# Patient Record
Sex: Male | Born: 1940 | Race: White | Hispanic: No | Smoking: Former smoker
Health system: Southern US, Community
[De-identification: ages and names within clinical notes are randomized; demographics above are authoritative.]

## PROBLEM LIST (undated history)

## (undated) DIAGNOSIS — M1990 Unspecified osteoarthritis, unspecified site: Secondary | ICD-10-CM

## (undated) DIAGNOSIS — I10 Essential (primary) hypertension: Secondary | ICD-10-CM

## (undated) DIAGNOSIS — G4730 Sleep apnea, unspecified: Secondary | ICD-10-CM

## (undated) DIAGNOSIS — I351 Nonrheumatic aortic (valve) insufficiency: Secondary | ICD-10-CM

## (undated) DIAGNOSIS — I2510 Atherosclerotic heart disease of native coronary artery without angina pectoris: Secondary | ICD-10-CM

## (undated) DIAGNOSIS — N411 Chronic prostatitis: Secondary | ICD-10-CM

## (undated) DIAGNOSIS — K219 Gastro-esophageal reflux disease without esophagitis: Secondary | ICD-10-CM

## (undated) DIAGNOSIS — I4891 Unspecified atrial fibrillation: Secondary | ICD-10-CM

## (undated) HISTORY — PX: CERVICAL SPINE SURGERY: SHX589

## (undated) HISTORY — PX: LUMBAR SPINE SURGERY: SHX701

## (undated) HISTORY — DX: Atherosclerotic heart disease of native coronary artery without angina pectoris: I25.10

## (undated) HISTORY — DX: Sleep apnea, unspecified: G47.30

## (undated) HISTORY — DX: Nonrheumatic aortic (valve) insufficiency: I35.1

## (undated) HISTORY — DX: Unspecified atrial fibrillation: I48.91

## (undated) HISTORY — PX: JOINT REPLACEMENT: SHX530

---

## 2012-07-02 ENCOUNTER — Other Ambulatory Visit: Payer: Self-pay | Admitting: Neurosurgery

## 2012-07-23 ENCOUNTER — Encounter (HOSPITAL_COMMUNITY): Payer: Self-pay | Admitting: Respiratory Therapy

## 2012-07-29 NOTE — Pre-Procedure Instructions (Signed)
20 ROHNAN BARTLESON  07/29/2012   Your procedure is scheduled on:  Wednesday, august 28th  Report to [facility] at 0630 AM.  Call this number if you have problems the morning of surgery: [phone]   Remember:   Do not eat food or drink:After Midnight.  Take these medicines the morning of surgery with A SIP OF WATER: metoprolol, proscar, prilosec, pain medication if needed   Do not wear jewelry, make-up or nail polish.  Do not wear lotions, powders, or perfumes.  Do not shave 48 hours prior to surgery. Men may shave face and neck.  Do not bring valuables to the hospital.  Contacts, dentures or bridgework may not be worn into surgery.  Leave suitcase in the car. After surgery it may be brought to your room.  For patients admitted to the hospital, checkout time is 11:00 AM the day of discharge.   Patients discharged the day of surgery will not be allowed to drive home.   Special Instructions: CHG Shower Use Special Wash: 1/2 bottle night before surgery and 1/2 bottle morning of surgery.   Please read over the following fact sheets that you were given: Pain Booklet, Coughing and Deep Breathing, Blood Transfusion Information, MRSA Information and Surgical Site Infection Prevention

## 2012-07-30 ENCOUNTER — Encounter (HOSPITAL_COMMUNITY): Payer: Self-pay

## 2012-07-30 ENCOUNTER — Ambulatory Visit (HOSPITAL_COMMUNITY)
Admission: RE | Admit: 2012-07-30 | Discharge: 2012-07-30 | Disposition: A | Discharge status: Home / Self Care | Payer: Medicare Other | Source: Ambulatory Visit | Attending: Neurosurgery | Admitting: Neurosurgery

## 2012-07-30 ENCOUNTER — Encounter (HOSPITAL_COMMUNITY)
Admission: RE | Admit: 2012-07-30 | Discharge: 2012-07-30 | Disposition: A | Discharge status: Home / Self Care | Payer: Medicare Other | Source: Ambulatory Visit | Attending: Neurosurgery | Admitting: Neurosurgery

## 2012-07-30 DIAGNOSIS — Z01818 Encounter for other preprocedural examination: Secondary | ICD-10-CM | POA: Insufficient documentation

## 2012-07-30 DIAGNOSIS — Z01812 Encounter for preprocedural laboratory examination: Secondary | ICD-10-CM | POA: Insufficient documentation

## 2012-07-30 DIAGNOSIS — Z01810 Encounter for preprocedural cardiovascular examination: Secondary | ICD-10-CM | POA: Insufficient documentation

## 2012-07-30 DIAGNOSIS — I10 Essential (primary) hypertension: Secondary | ICD-10-CM | POA: Insufficient documentation

## 2012-07-30 HISTORY — DX: Unspecified osteoarthritis, unspecified site: M19.90

## 2012-07-30 HISTORY — DX: Essential (primary) hypertension: I10

## 2012-07-30 HISTORY — DX: Gastro-esophageal reflux disease without esophagitis: K21.9

## 2012-07-30 LAB — TYPE AND SCREEN
ABO/RH(D): B POS
Antibody Screen: NEGATIVE

## 2012-07-30 LAB — BASIC METABOLIC PANEL
BUN: 18 mg/dL (ref 6–23)
CO2: 28 mEq/L (ref 19–32)
Calcium: 10.1 mg/dL (ref 8.4–10.5)
Chloride: 102 mEq/L (ref 96–112)
Creatinine, Ser: 0.98 mg/dL (ref 0.50–1.35)
GFR calc Af Amer: >90 mL/min (ref >90)
GFR calc non Af Amer: 80 mL/min — ABNORMAL LOW (ref >90)
Glucose, Bld: 102 mg/dL — ABNORMAL HIGH (ref 70–99)
Potassium: 4.9 mEq/L (ref 3.5–5.1)
Sodium: 139 mEq/L (ref 135–145)

## 2012-07-30 LAB — SURGICAL PCR SCREEN
MRSA, PCR: NEGATIVE
Staphylococcus aureus: POSITIVE — AB

## 2012-07-30 LAB — CBC
HCT: 41.7 % (ref 39.0–52.0)
Hemoglobin: 13.9 g/dL (ref 13.0–17.0)
MCH: 27.1 pg (ref 26.0–34.0)
MCHC: 33.3 g/dL (ref 30.0–36.0)
MCV: 81.4 fL (ref 78.0–100.0)
Platelets: 249 10*3/uL (ref 150–400)
RBC: 5.12 MIL/uL (ref 4.22–5.81)
RDW: 13.6 % (ref 11.5–15.5)
WBC: 5.8 10*3/uL (ref 4.0–10.5)

## 2012-07-30 LAB — ABO/RH: ABO/RH(D): B POS

## 2012-08-05 MED ORDER — CEFAZOLIN SODIUM-DEXTROSE 2-3 GM-% IV SOLR
2.0000 g | INTRAVENOUS | Status: AC
  Administered 2012-08-06: 1 g via INTRAVENOUS
  Administered 2012-08-06: 2 g via INTRAVENOUS
  Filled 2012-08-05: qty 50

## 2012-08-06 ENCOUNTER — Inpatient Hospital Stay (HOSPITAL_COMMUNITY)
Admission: RE | Admit: 2012-08-06 | Discharge: 2012-08-09 | DRG: 460 | Disposition: A | Discharge status: Home / Self Care | Payer: Medicare Other | Source: Ambulatory Visit | Attending: Neurosurgery | Admitting: Neurosurgery

## 2012-08-06 ENCOUNTER — Ambulatory Visit (HOSPITAL_COMMUNITY): Payer: Medicare Other

## 2012-08-06 ENCOUNTER — Encounter (HOSPITAL_COMMUNITY): Payer: Self-pay | Admitting: Anesthesiology

## 2012-08-06 ENCOUNTER — Encounter (HOSPITAL_COMMUNITY): Payer: Self-pay | Admitting: Surgery

## 2012-08-06 ENCOUNTER — Ambulatory Visit (HOSPITAL_COMMUNITY): Payer: Medicare Other | Admitting: Anesthesiology

## 2012-08-06 ENCOUNTER — Encounter (HOSPITAL_COMMUNITY)
Admission: RE | Disposition: A | Discharge status: Home / Self Care | Payer: Self-pay | Source: Ambulatory Visit | Attending: Neurosurgery

## 2012-08-06 DIAGNOSIS — M47817 Spondylosis without myelopathy or radiculopathy, lumbosacral region: Secondary | ICD-10-CM | POA: Diagnosis present

## 2012-08-06 DIAGNOSIS — R339 Retention of urine, unspecified: Secondary | ICD-10-CM | POA: Diagnosis not present

## 2012-08-06 DIAGNOSIS — M48062 Spinal stenosis, lumbar region with neurogenic claudication: Secondary | ICD-10-CM | POA: Diagnosis present

## 2012-08-06 DIAGNOSIS — Z833 Family history of diabetes mellitus: Secondary | ICD-10-CM

## 2012-08-06 DIAGNOSIS — Z79899 Other long term (current) drug therapy: Secondary | ICD-10-CM

## 2012-08-06 DIAGNOSIS — Q762 Congenital spondylolisthesis: Secondary | ICD-10-CM

## 2012-08-06 DIAGNOSIS — M5137 Other intervertebral disc degeneration, lumbosacral region: Principal | ICD-10-CM | POA: Diagnosis present

## 2012-08-06 DIAGNOSIS — Z823 Family history of stroke: Secondary | ICD-10-CM

## 2012-08-06 DIAGNOSIS — I10 Essential (primary) hypertension: Secondary | ICD-10-CM | POA: Diagnosis present

## 2012-08-06 DIAGNOSIS — Z981 Arthrodesis status: Secondary | ICD-10-CM

## 2012-08-06 DIAGNOSIS — M51379 Other intervertebral disc degeneration, lumbosacral region without mention of lumbar back pain or lower extremity pain: Principal | ICD-10-CM | POA: Diagnosis present

## 2012-08-06 DIAGNOSIS — Z7982 Long term (current) use of aspirin: Secondary | ICD-10-CM

## 2012-08-06 DIAGNOSIS — Z96649 Presence of unspecified artificial hip joint: Secondary | ICD-10-CM

## 2012-08-06 DIAGNOSIS — Z8249 Family history of ischemic heart disease and other diseases of the circulatory system: Secondary | ICD-10-CM

## 2012-08-06 SURGERY — POSTERIOR LUMBAR FUSION 2 LEVEL
Anesthesia: General | Site: Back | Wound class: Clean

## 2012-08-06 MED ORDER — ONDANSETRON HCL 4 MG/2ML IJ SOLN: 4.0000 mg | Freq: Once | INTRAMUSCULAR | Status: DC | PRN

## 2012-08-06 MED ORDER — HYDROXYZINE HCL 25 MG PO TABS
50.0000 mg | ORAL_TABLET | ORAL | Status: DC | PRN
  Filled 2012-08-06: qty 2

## 2012-08-06 MED ORDER — METOPROLOL SUCCINATE ER 25 MG PO TB24
25.0000 mg | ORAL_TABLET | Freq: Every day | ORAL | Status: DC
  Administered 2012-08-07 – 2012-08-09 (×2): 25 mg via ORAL
  Filled 2012-08-06 (×4): qty 1

## 2012-08-06 MED ORDER — ONDANSETRON HCL 4 MG/2ML IJ SOLN
INTRAMUSCULAR | Status: AC
  Filled 2012-08-06: qty 2

## 2012-08-06 MED ORDER — EPHEDRINE SULFATE 50 MG/ML IJ SOLN
INTRAMUSCULAR | Status: DC | PRN
  Administered 2012-08-06 (×4): 10 mg via INTRAVENOUS

## 2012-08-06 MED ORDER — OXYCODONE-ACETAMINOPHEN 5-325 MG PO TABS
1.0000 | ORAL_TABLET | ORAL | Status: DC | PRN
  Administered 2012-08-08 – 2012-08-09 (×4): 2 via ORAL
  Filled 2012-08-06 (×4): qty 2

## 2012-08-06 MED ORDER — SODIUM CHLORIDE 0.9 % IJ SOLN: 3.0000 mL | INTRAMUSCULAR | Status: DC | PRN

## 2012-08-06 MED ORDER — HYDROCODONE-ACETAMINOPHEN 5-325 MG PO TABS: 1.0000 | ORAL_TABLET | ORAL | Status: DC | PRN

## 2012-08-06 MED ORDER — ACETAMINOPHEN 650 MG RE SUPP: 650.0000 mg | RECTAL | Status: DC | PRN

## 2012-08-06 MED ORDER — HYDROMORPHONE HCL PF 1 MG/ML IJ SOLN
INTRAMUSCULAR | Status: AC
  Filled 2012-08-06: qty 1

## 2012-08-06 MED ORDER — KETOROLAC TROMETHAMINE 30 MG/ML IJ SOLN: 15.0000 mg | Freq: Once | INTRAMUSCULAR | Status: DC

## 2012-08-06 MED ORDER — ACETAMINOPHEN 10 MG/ML IV SOLN
INTRAVENOUS | Status: DC | PRN
  Administered 2012-08-06: 1000 mg via INTRAVENOUS

## 2012-08-06 MED ORDER — LIDOCAINE HCL (CARDIAC) 20 MG/ML IV SOLN
INTRAVENOUS | Status: DC | PRN
  Administered 2012-08-06: 100 mg via INTRAVENOUS

## 2012-08-06 MED ORDER — ACETAMINOPHEN 10 MG/ML IV SOLN
INTRAVENOUS | Status: AC
  Filled 2012-08-06: qty 100

## 2012-08-06 MED ORDER — BISACODYL 10 MG RE SUPP
10.0000 mg | Freq: Every day | RECTAL | Status: DC | PRN
  Administered 2012-08-07: 10 mg via RECTAL
  Filled 2012-08-06: qty 1

## 2012-08-06 MED ORDER — SODIUM CHLORIDE 0.9 % IV SOLN
INTRAVENOUS | Status: AC
  Filled 2012-08-06: qty 500

## 2012-08-06 MED ORDER — BACITRACIN 50000 UNITS IM SOLR
INTRAMUSCULAR | Status: AC
  Filled 2012-08-06: qty 1

## 2012-08-06 MED ORDER — PHENOL 1.4 % MT LIQD: 1.0000 | OROMUCOSAL | Status: DC | PRN

## 2012-08-06 MED ORDER — CYCLOBENZAPRINE HCL 10 MG PO TABS
10.0000 mg | ORAL_TABLET | Freq: Three times a day (TID) | ORAL | Status: DC | PRN
  Administered 2012-08-07: 10 mg via ORAL
  Filled 2012-08-06: qty 1

## 2012-08-06 MED ORDER — FINASTERIDE 5 MG PO TABS
5.0000 mg | ORAL_TABLET | Freq: Every day | ORAL | Status: DC
  Administered 2012-08-07 – 2012-08-09 (×3): 5 mg via ORAL
  Filled 2012-08-06 (×4): qty 1

## 2012-08-06 MED ORDER — MENTHOL 3 MG MT LOZG
1.0000 | LOZENGE | OROMUCOSAL | Status: DC | PRN
  Filled 2012-08-06: qty 9

## 2012-08-06 MED ORDER — NEOSTIGMINE METHYLSULFATE 1 MG/ML IJ SOLN
INTRAMUSCULAR | Status: DC | PRN
  Administered 2012-08-06: 4 mg via INTRAVENOUS

## 2012-08-06 MED ORDER — ONDANSETRON HCL 4 MG/2ML IJ SOLN
INTRAMUSCULAR | Status: DC | PRN
  Administered 2012-08-06: 4 mg via INTRAVENOUS

## 2012-08-06 MED ORDER — KETOROLAC TROMETHAMINE 15 MG/ML IJ SOLN
15.0000 mg | Freq: Four times a day (QID) | INTRAMUSCULAR | Status: AC
  Administered 2012-08-06 – 2012-08-07 (×6): 15 mg via INTRAVENOUS
  Filled 2012-08-06 (×9): qty 1

## 2012-08-06 MED ORDER — ALBUMIN HUMAN 5 % IV SOLN
INTRAVENOUS | Status: DC | PRN
  Administered 2012-08-06: 12:00:00 via INTRAVENOUS

## 2012-08-06 MED ORDER — MIDAZOLAM HCL 5 MG/5ML IJ SOLN
INTRAMUSCULAR | Status: DC | PRN
  Administered 2012-08-06: 2 mg via INTRAVENOUS

## 2012-08-06 MED ORDER — LISINOPRIL 20 MG PO TABS
20.0000 mg | ORAL_TABLET | Freq: Every day | ORAL | Status: DC
  Administered 2012-08-07 – 2012-08-09 (×2): 20 mg via ORAL
  Filled 2012-08-06 (×4): qty 1

## 2012-08-06 MED ORDER — SODIUM CHLORIDE 0.9 % IR SOLN
Status: DC | PRN
  Administered 2012-08-06 (×2)

## 2012-08-06 MED ORDER — MAGNESIUM HYDROXIDE 400 MG/5ML PO SUSP
30.0000 mL | Freq: Every day | ORAL | Status: DC | PRN
  Administered 2012-08-07 – 2012-08-08 (×2): 30 mL via ORAL
  Filled 2012-08-06 (×2): qty 30

## 2012-08-06 MED ORDER — HYDROCHLOROTHIAZIDE 25 MG PO TABS
25.0000 mg | ORAL_TABLET | Freq: Every day | ORAL | Status: DC
Start: 2012-08-06 — End: 2012-08-09
  Administered 2012-08-07: 25 mg via ORAL
  Filled 2012-08-06 (×4): qty 1

## 2012-08-06 MED ORDER — ACETAMINOPHEN 10 MG/ML IV SOLN
1000.0000 mg | Freq: Four times a day (QID) | INTRAVENOUS | Status: AC
  Administered 2012-08-06 – 2012-08-07 (×4): 1000 mg via INTRAVENOUS
  Filled 2012-08-06 (×4): qty 100

## 2012-08-06 MED ORDER — ROCURONIUM BROMIDE 100 MG/10ML IV SOLN
INTRAVENOUS | Status: DC | PRN
  Administered 2012-08-06: 50 mg via INTRAVENOUS

## 2012-08-06 MED ORDER — LACTATED RINGERS IV SOLN
INTRAVENOUS | Status: DC | PRN
  Administered 2012-08-06 (×4): via INTRAVENOUS

## 2012-08-06 MED ORDER — VECURONIUM BROMIDE 10 MG IV SOLR
INTRAVENOUS | Status: DC | PRN
  Administered 2012-08-06: 2 mg via INTRAVENOUS
  Administered 2012-08-06: 1 mg via INTRAVENOUS
  Administered 2012-08-06: 2 mg via INTRAVENOUS
  Administered 2012-08-06 (×2): 1 mg via INTRAVENOUS
  Administered 2012-08-06 (×2): 2 mg via INTRAVENOUS
  Administered 2012-08-06: 1 mg via INTRAVENOUS

## 2012-08-06 MED ORDER — ZOLPIDEM TARTRATE 5 MG PO TABS
5.0000 mg | ORAL_TABLET | Freq: Every evening | ORAL | Status: DC | PRN
  Administered 2012-08-07 – 2012-08-08 (×2): 5 mg via ORAL
  Filled 2012-08-06 (×3): qty 1

## 2012-08-06 MED ORDER — DEXTROSE-NACL 5-0.45 % IV SOLN
INTRAVENOUS | Status: DC
  Administered 2012-08-07: via INTRAVENOUS

## 2012-08-06 MED ORDER — BUPIVACAINE HCL (PF) 0.5 % IJ SOLN
INTRAMUSCULAR | Status: DC | PRN
  Administered 2012-08-06: 50 mL

## 2012-08-06 MED ORDER — TAMSULOSIN HCL 0.4 MG PO CAPS
0.4000 mg | ORAL_CAPSULE | Freq: Every day | ORAL | Status: DC
  Administered 2012-08-06 – 2012-08-08 (×3): 0.4 mg via ORAL
  Filled 2012-08-06 (×4): qty 1

## 2012-08-06 MED ORDER — FENTANYL CITRATE 0.05 MG/ML IJ SOLN
INTRAMUSCULAR | Status: DC | PRN
  Administered 2012-08-06 (×2): 50 ug via INTRAVENOUS
  Administered 2012-08-06: 100 ug via INTRAVENOUS
  Administered 2012-08-06 (×4): 50 ug via INTRAVENOUS

## 2012-08-06 MED ORDER — THROMBIN 20000 UNITS EX KIT
PACK | CUTANEOUS | Status: DC | PRN
  Administered 2012-08-06: 20000 [IU] via TOPICAL

## 2012-08-06 MED ORDER — CEFAZOLIN SODIUM 1-5 GM-% IV SOLN
INTRAVENOUS | Status: AC
  Filled 2012-08-06: qty 50

## 2012-08-06 MED ORDER — SODIUM CHLORIDE 0.9 % IJ SOLN
3.0000 mL | Freq: Two times a day (BID) | INTRAMUSCULAR | Status: DC
  Administered 2012-08-07 – 2012-08-08 (×4): 3 mL via INTRAVENOUS

## 2012-08-06 MED ORDER — ACETAMINOPHEN 325 MG PO TABS: 650.0000 mg | ORAL_TABLET | ORAL | Status: DC | PRN

## 2012-08-06 MED ORDER — ALUM & MAG HYDROXIDE-SIMETH 200-200-20 MG/5ML PO SUSP: 30.0000 mL | Freq: Four times a day (QID) | ORAL | Status: DC | PRN

## 2012-08-06 MED ORDER — HEMOSTATIC AGENTS (NO CHARGE) OPTIME
TOPICAL | Status: DC | PRN
  Administered 2012-08-06: 1 via TOPICAL

## 2012-08-06 MED ORDER — LIDOCAINE-EPINEPHRINE 1 %-1:100000 IJ SOLN
INTRAMUSCULAR | Status: DC | PRN
  Administered 2012-08-06: 20 mL

## 2012-08-06 MED ORDER — PROPOFOL 10 MG/ML IV EMUL
INTRAVENOUS | Status: DC | PRN
  Administered 2012-08-06: 200 mg via INTRAVENOUS

## 2012-08-06 MED ORDER — 0.9 % SODIUM CHLORIDE (POUR BTL) OPTIME
TOPICAL | Status: DC | PRN
  Administered 2012-08-06 (×4): 1000 mL

## 2012-08-06 MED ORDER — SODIUM CHLORIDE 0.9 % IV SOLN: 250.0000 mL | INTRAVENOUS | Status: DC

## 2012-08-06 MED ORDER — HYDROXYZINE HCL 50 MG/ML IM SOLN
50.0000 mg | INTRAMUSCULAR | Status: DC | PRN
  Administered 2012-08-06: 50 mg via INTRAMUSCULAR
  Filled 2012-08-06: qty 1

## 2012-08-06 MED ORDER — PANTOPRAZOLE SODIUM 40 MG PO TBEC
40.0000 mg | DELAYED_RELEASE_TABLET | Freq: Every day | ORAL | Status: DC
  Administered 2012-08-07 – 2012-08-08 (×2): 40 mg via ORAL
  Filled 2012-08-06 (×2): qty 1

## 2012-08-06 MED ORDER — KETOROLAC TROMETHAMINE 30 MG/ML IJ SOLN
INTRAMUSCULAR | Status: AC
  Filled 2012-08-06: qty 1

## 2012-08-06 MED ORDER — HYDROMORPHONE HCL PF 1 MG/ML IJ SOLN
0.2500 mg | INTRAMUSCULAR | Status: DC | PRN
  Administered 2012-08-06 (×4): 0.5 mg via INTRAVENOUS

## 2012-08-06 MED ORDER — GLYCOPYRROLATE 0.2 MG/ML IJ SOLN
INTRAMUSCULAR | Status: DC | PRN
  Administered 2012-08-06: .6 mg via INTRAVENOUS

## 2012-08-06 MED ORDER — ONDANSETRON HCL 4 MG/2ML IJ SOLN
4.0000 mg | Freq: Four times a day (QID) | INTRAMUSCULAR | Status: DC | PRN
  Administered 2012-08-06: 4 mg via INTRAVENOUS

## 2012-08-06 MED ORDER — OXYCODONE HCL 5 MG PO TABS
5.0000 mg | ORAL_TABLET | ORAL | Status: DC | PRN
  Administered 2012-08-06 – 2012-08-09 (×9): 10 mg via ORAL
  Filled 2012-08-06 (×9): qty 2

## 2012-08-06 MED ORDER — MORPHINE SULFATE 4 MG/ML IJ SOLN
4.0000 mg | INTRAMUSCULAR | Status: DC | PRN
  Administered 2012-08-06: 4 mg via INTRAMUSCULAR
  Filled 2012-08-06: qty 1

## 2012-08-06 SURGICAL SUPPLY — 95 items
ADH SKN CLS APL DERMABOND .7 (GAUZE/BANDAGES/DRESSINGS) ×1
APL SKNCLS STERI-STRIP NONHPOA (GAUZE/BANDAGES/DRESSINGS)
BAG DECANTER FOR FLEXI CONT (MISCELLANEOUS) ×2 IMPLANT
BENZOIN TINCTURE PRP APPL 2/3 (GAUZE/BANDAGES/DRESSINGS) IMPLANT
BLADE SURG ROTATE 9660 (MISCELLANEOUS) IMPLANT
BRUSH SCRUB EZ PLAIN DRY (MISCELLANEOUS) ×2 IMPLANT
BUR ACORN 6.0 ACORN (BURR) IMPLANT
BUR ACRON 5.0MM COATED (BURR) ×3 IMPLANT
BUR MATCHSTICK NEURO 3.0 LAGG (BURR) ×2 IMPLANT
CANISTER SUCTION 2500CC (MISCELLANEOUS) ×2 IMPLANT
CLOTH BEACON ORANGE TIMEOUT ST (SAFETY) ×2 IMPLANT
CONT SPEC 4OZ CLIKSEAL STRL BL (MISCELLANEOUS) ×2 IMPLANT
CONT SPEC STER OR (MISCELLANEOUS) ×2 IMPLANT
COVER BACK TABLE 24X17X13 BIG (DRAPES) IMPLANT
COVER TABLE BACK 60X90 (DRAPES) ×2 IMPLANT
CROSSLINK MEDIUM (Orthopedic Implant) ×1 IMPLANT
DERMABOND ADVANCED (GAUZE/BANDAGES/DRESSINGS) ×1
DERMABOND ADVANCED .7 DNX12 (GAUZE/BANDAGES/DRESSINGS) ×2 IMPLANT
DRAPE C-ARM 42X72 X-RAY (DRAPES) ×5 IMPLANT
DRAPE LAPAROTOMY 100X72X124 (DRAPES) ×2 IMPLANT
DRAPE POUCH INSTRU U-SHP 10X18 (DRAPES) ×2 IMPLANT
DRAPE PROXIMA HALF (DRAPES) IMPLANT
DRAPE SURG 17X23 STRL (DRAPES) ×2 IMPLANT
DRESSING TELFA 8X3 (GAUZE/BANDAGES/DRESSINGS) IMPLANT
DRSG EMULSION OIL 3X3 NADH (GAUZE/BANDAGES/DRESSINGS) IMPLANT
DURAPREP 26ML APPLICATOR (WOUND CARE) ×2 IMPLANT
ELECT REM PT RETURN 9FT ADLT (ELECTROSURGICAL) ×2
ELECTRODE REM PT RTRN 9FT ADLT (ELECTROSURGICAL) ×1 IMPLANT
GAUZE SPONGE 4X4 16PLY XRAY LF (GAUZE/BANDAGES/DRESSINGS) IMPLANT
GLOVE BIOGEL PI IND STRL 6.5 (GLOVE) IMPLANT
GLOVE BIOGEL PI IND STRL 8 (GLOVE) ×2 IMPLANT
GLOVE BIOGEL PI INDICATOR 6.5 (GLOVE) ×5
GLOVE BIOGEL PI INDICATOR 8 (GLOVE) ×2
GLOVE ECLIPSE 6.5 STRL STRAW (GLOVE) ×1 IMPLANT
GLOVE ECLIPSE 7.5 STRL STRAW (GLOVE) ×5 IMPLANT
GLOVE EXAM NITRILE LRG STRL (GLOVE) IMPLANT
GLOVE EXAM NITRILE MD LF STRL (GLOVE) ×2 IMPLANT
GLOVE EXAM NITRILE XL STR (GLOVE) IMPLANT
GLOVE EXAM NITRILE XS STR PU (GLOVE) IMPLANT
GLOVE INDICATOR 7.0 STRL GRN (GLOVE) ×2 IMPLANT
GLOVE INDICATOR 7.5 STRL GRN (GLOVE) ×1 IMPLANT
GOWN BRE IMP SLV AUR LG STRL (GOWN DISPOSABLE) ×1 IMPLANT
GOWN BRE IMP SLV AUR XL STRL (GOWN DISPOSABLE) ×7 IMPLANT
GOWN STRL REIN 2XL LVL4 (GOWN DISPOSABLE) IMPLANT
KIT BASIN OR (CUSTOM PROCEDURE TRAY) ×2 IMPLANT
KIT INFUSE SMALL (Orthopedic Implant) ×1 IMPLANT
KIT POSITION SURG JACKSON T1 (MISCELLANEOUS) ×2 IMPLANT
KIT ROOM TURNOVER OR (KITS) ×2 IMPLANT
MILL MEDIUM DISP (BLADE) ×2 IMPLANT
NDL 18GX1X1/2 (RX/OR ONLY) (NEEDLE) ×1 IMPLANT
NDL HYPO 25X1 1.5 SAFETY (NEEDLE) ×1 IMPLANT
NDL SPNL 18GX3.5 QUINCKE PK (NEEDLE) IMPLANT
NEEDLE 18GX1X1/2 (RX/OR ONLY) (NEEDLE) ×2 IMPLANT
NEEDLE BONE MARROW 8GAX6 (NEEDLE) ×1 IMPLANT
NEEDLE HYPO 25X1 1.5 SAFETY (NEEDLE) ×2 IMPLANT
NEEDLE SPNL 18GX3.5 QUINCKE PK (NEEDLE) IMPLANT
NS IRRIG 1000ML POUR BTL (IV SOLUTION) ×2 IMPLANT
PACK LAMINECTOMY NEURO (CUSTOM PROCEDURE TRAY) ×2 IMPLANT
PACK VITOSS BIOACTIVE 5CC (Neuro Prosthesis/Implant) ×1 IMPLANT
PAD ARMBOARD 7.5X6 YLW CONV (MISCELLANEOUS) ×6 IMPLANT
PATTIES SURGICAL .5 X.5 (GAUZE/BANDAGES/DRESSINGS) ×1 IMPLANT
PATTIES SURGICAL .5 X1 (DISPOSABLE) IMPLANT
PATTIES SURGICAL 1X1 (DISPOSABLE) IMPLANT
PEEK PLIF AVS 10X25X4 (Peek) ×2 IMPLANT
PEEK PLIF AVS 14X25X4 (Peek) ×2 IMPLANT
ROD 70MM (Rod) ×4 IMPLANT
ROD SPNL 70X5.5 NS TI RDS (Rod) IMPLANT
ROD SPNL 70X5.5XNS TI RDS (Rod) IMPLANT
SCREW 5.75X40M (Screw) ×1 IMPLANT
SCREW 5.75X45MM (Screw) ×1 IMPLANT
SCREW 5.75X50MM (Screw) ×2 IMPLANT
SCREW RADIUS 5.75X55MM (Screw) ×2 IMPLANT
SLEEVE SURGEON STRL (DRAPES) ×1 IMPLANT
SPONGE GAUZE 4X4 12PLY (GAUZE/BANDAGES/DRESSINGS) ×2 IMPLANT
SPONGE LAP 4X18 X RAY DECT (DISPOSABLE) ×1 IMPLANT
SPONGE NEURO XRAY DETECT 1X3 (DISPOSABLE) IMPLANT
SPONGE SURGIFOAM ABS GEL 100 (HEMOSTASIS) IMPLANT
STAPLER SKIN PROX WIDE 3.9 (STAPLE) IMPLANT
STRIP BIOACTIVE VITOSS 25X100X (Neuro Prosthesis/Implant) ×2 IMPLANT
STRIP CLOSURE SKIN 1/2X4 (GAUZE/BANDAGES/DRESSINGS) ×2 IMPLANT
SUT PROLENE 6 0 BV (SUTURE) IMPLANT
SUT VIC AB 1 CT1 18XBRD ANBCTR (SUTURE) ×2 IMPLANT
SUT VIC AB 1 CT1 8-18 (SUTURE) ×8
SUT VIC AB 2-0 CP2 18 (SUTURE) ×5 IMPLANT
SYR 20CC LL (SYRINGE) ×2 IMPLANT
SYR 3ML LL SCALE MARK (SYRINGE) ×8 IMPLANT
SYR 5ML LL (SYRINGE) IMPLANT
SYR CONTROL 10ML LL (SYRINGE) ×2 IMPLANT
SYR INSULIN 1ML 31GX6 SAFETY (SYRINGE) IMPLANT
TAPE CLOTH SURG 4X10 WHT LF (GAUZE/BANDAGES/DRESSINGS) ×1 IMPLANT
TOWEL OR 17X24 6PK STRL BLUE (TOWEL DISPOSABLE) ×2 IMPLANT
TOWEL OR 17X26 10 PK STRL BLUE (TOWEL DISPOSABLE) ×2 IMPLANT
TRAP SPECIMEN MUCOUS 40CC (MISCELLANEOUS) ×2 IMPLANT
TRAY FOLEY CATH 14FRSI W/METER (CATHETERS) ×2 IMPLANT
WATER STERILE IRR 1000ML POUR (IV SOLUTION) ×2 IMPLANT

## 2012-08-06 NOTE — Transfer of Care (Signed)
Immediate Anesthesia Transfer of Care Note  Patient: Vincent Rivas  Procedure(s) Performed: Procedure(s) (LRB): POSTERIOR LUMBAR FUSION 2 LEVEL (N/A)  Patient Location: PACU  Anesthesia Type: General  Level of Consciousness: awake and alert   Airway & Oxygen Therapy: Patient Spontanous Breathing and Patient connected to nasal cannula oxygen  Post-op Assessment: Report given to PACU RN and Post -op Vital signs reviewed and stable  Post vital signs: Reviewed and stable  Complications: No apparent anesthesia complications

## 2012-08-06 NOTE — Anesthesia Procedure Notes (Signed)
Procedure Name: Intubation Date/Time: 08/06/2012 8:33 AM Performed by: Garen Lah Pre-anesthesia Checklist: Patient identified, Timeout performed, Emergency Drugs available, Suction available and Patient being monitored Patient Re-evaluated:Patient Re-evaluated prior to inductionOxygen Delivery Method: Circle system utilized Preoxygenation: Pre-oxygenation with 100% oxygen Intubation Type: IV induction Ventilation: Mask ventilation without difficulty Grade View: Grade IV Tube type: Oral Tube size: 8.0 mm Number of attempts: 3 Airway Equipment and Method: Rigid stylet and Video-laryngoscopy Placement Confirmation: positive ETCO2 and breath sounds checked- equal and bilateral Secured at: 24 cm Tube secured with: Tape Dental Injury: Teeth and Oropharynx as per pre-operative assessment  Difficulty Due To: Difficulty was anticipated, Difficult Airway- due to limited oral opening and Difficult Airway- due to large tongue

## 2012-08-06 NOTE — Progress Notes (Signed)
Filed Vitals:   08/06/12 1600 08/06/12 1609 08/06/12 1612 08/06/12 1641  BP:  143/58  139/58  Pulse: 74 79  83  Temp:   97.2 F (36.2 C) 97.1 F (36.2 C)  TempSrc:      Resp: 13 12  12   SpO2: 99% 98%  99%    Patient resting comfortably in bed, about to start eating dinner. Dressing clean and dry. Moving lower from his well. Foley to straight drainage.  Plan: Patient doing well following surgery, discussed with his nurse Selena Batten beginning to ambulate the patient in the halls later this afternoon and this evening.  Hewitt Shorts, MD 08/06/2012, 5:25 PM

## 2012-08-06 NOTE — Anesthesia Postprocedure Evaluation (Signed)
  Anesthesia Post-op Note  Patient: Vincent Rivas  Procedure(s) Performed: Procedure(s) (LRB): POSTERIOR LUMBAR FUSION 2 LEVEL (N/A)  Patient Location: PACU  Anesthesia Type: General  Level of Consciousness: awake, alert  and oriented  Airway and Oxygen Therapy: Patient Spontanous Breathing and Patient connected to nasal cannula oxygen  Post-op Pain: mild  Post-op Assessment: Post-op Vital signs reviewed  Post-op Vital Signs: Reviewed  Complications: No apparent anesthesia complications

## 2012-08-06 NOTE — Preoperative (Signed)
Beta Blockers   Reason not to administer Beta Blockers:Not Applicable. Metoprolol 25 mg @ 0530 08/06/12

## 2012-08-06 NOTE — Op Note (Signed)
08/06/2012  2:33 PM  PATIENT:  Vincent Rivas  71 y.o. 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, lumbar spondylosis, lumbar degenerative disc disease  PROCEDURE:  Procedure(s): POSTERIOR LUMBAR FUSION 2 LEVEL: L3-L5 decompressive lumbar laminectomy, bilateral L3-4 and L4-5 facetectomy and foraminotomy, with decompression of the exiting L3, L4, and L5 nerve roots bilaterally, with decompression beyond that required for interbody arthrodesis, with microdissection microsurgical technique, L3-4 and L4-5 posterior lumbar interbody arthrodesis with AVS peek interbody implants, Vitoss BA with bone marrow aspirate, and infuse, and bilateral L3-L5 posterior lateral arthrodesis with radius posterior instrumentation, Vitoss BA with bone marrow aspirate, and infuse, and locally harvested morcellized autograft  SURGEON:  Surgeon(s): Hewitt Shorts, MD Carmela Hurt, MD  ASSISTANTS: Coletta Memos, M.D.  ANESTHESIA:   general  EBL:  Total I/O In: 3510 [I.V.:3000; Blood:260; IV Piggyback:250] Out: 1110 [Urine:510; Blood:600]  BLOOD ADMINISTERED:260 CC CELLSAVER  COUNT: Correct per nursing staff  DICTATION: Patient was brought to the operating room placed under general endotracheal anesthesia. The patient was turned to prone position, the lumbar region was prepped with Betadine soap and solution and draped in a sterile fashion. The midline was infiltrated with local anesthesia with epinephrine. A localizing x-ray was taken and then a midline incision was made and carried down through the subcutaneous tissue, bipolar cautery and electrocautery were used to maintain hemostasis. Dissection was carried down to the lumbar fascia. The fascia was incised bilaterally and the paraspinal muscles were dissected with a  spinous process and lamina in a subperiosteal fashion. Another x-ray was taken for localization and the L3, L4, and L5 levels were localized. Dissection was then carried out laterally over the facet complexes and the transverse processes of L3, L4, and L5 were exposed and decorticated.  Laminectomy, facetectomy, and foraminotomy was performed using double-action rongeurs, the high-speed drill, and Kerrison punches. There was markedly hypertrophic facet bilaterally. Decompression was performed using the high-speed drill and Kerrison punches. Severe central canal stenosis was decompressed. Dissection was carried out laterally including facetectomy and foraminotomies with decompression of the stenotic compression of the L3, L4, and L5 nerve roots. Once the decompression of the stenotic compression of the thecal sac and exiting nerve roots was completed we proceeded with the posterior lumbar interbody arthrodesis. The annulus at each level was incised bilaterally and the disc space entered. A thorough discectomy was performed using pituitary rongeurs and curettes. Once the discectomy was completed we began to prepare the endplate surfaces, removing the cartilaginous endplates surface with paddle curettes. We then measured the height of the intervertebral disc space. We selected 10 x 25 x 4 x 8 AVS peek interbody implants for the L3-4 level, and a 14 x 25 x 4 x 11 AVS peek interbody implants for the L4-5 level.  The C-arm fluoroscope was then draped and brought in the field and we identified the pedicle entry points bilaterally at the L3, L4, and L5 levels. Each of the 6 pedicles was probed, we aspirated bone marrow aspirate from the vertebral bodies, this was injected over a 2 10 cc strips and 1 5 cc strip of Vitoss BA. Then each of the pedicles was examined with the ball probe, good bony surfaces were found and no bony cuts were found. Each of the pedicles was then tapped with a 5.25 mm tap, again examined with the  ball probe good threading was found and no bony cuts were found. We then placed 5.75 by 50 millimeter screws bilaterally at the L3 level, 5.75 by 55 millimeter screws bilaterally at the L4 level, and 5.75 by 45 millimeter screw on the left at the L4-5  level and 5.75 x 40 mm screw on the right at the L4-5 level.  We then packed the AVS peek interbody implants with Vitoss BA with bone marrow aspirate and infuse, and then placed the first implant at the L4-5 level on the right side, carefully retracting the thecal sac and nerve root medially. We then went back to the left side and packed the midline with additional Vitoss BA with bone marrow aspirate and then placed a second implant on the left side again retracting the thecal sac and nerve root medially. Additional Vitoss BA with bone marrow aspirate was packed lateral to the implants.  Then at the L3-4 level, we placed the first implant on the right side, carefully retracting the thecal sac and nerve root medially. We then went back to the left side and packed the midline with additional Vitoss BA with bone marrow aspirate and then placed a second implant on the left side, again retracting the thecal sac and nerve root medially.   We then packed the lateral gutter over the transverse processes and intertransverse space with Vitoss BA with bone marrow aspirate, and infuse, and locally harvested morcellized autograft. We then selected a 70 mm rods, using a pre-lordosed rod on the right and a maximum pre-lordosed rod on the left.  They were placed within the screw heads and secured with locking caps once all 6 locking caps were placed final tightening was performed against a counter torque.  The wound had been irrigated multiple times during the procedure with saline solution and bacitracin solution, good hemostasis was established with a combination of bipolar cautery and Gelfoam with thrombin. Once good hemostasis was confirmed we proceeded with closure  paraspinal muscles deep fascia and Scarpa's fascia were closed with interrupted undyed 1 Vicryl sutures the subcutaneous and subcuticular closed with interrupted inverted 2-0 undyed Vicryl sutures the skin edges were approximated with Dermabond. The was dressed with sterile gauze and Hypafix.  Following surgery the patient was turned back to the supine position to be reversed and the anesthetic extubated and transferred to the recovery room for further care.   PLAN OF CARE: Admit to inpatient   PATIENT DISPOSITION:  PACU - hemodynamically stable.   Delay start of Pharmacological VTE agent (>24hrs) due to surgical blood loss or risk of bleeding:  yes

## 2012-08-06 NOTE — Anesthesia Preprocedure Evaluation (Addendum)
Anesthesia Evaluation  Patient identified by MRN, date of birth, ID band Patient awake    Reviewed: Allergy & Precautions, H&P , NPO status , Patient's Chart, lab work & pertinent test results, reviewed documented beta blocker date and time   Airway Mallampati: III  Neck ROM: Full  Mouth opening: Limited Mouth Opening  Dental  (+) Teeth Intact and Dental Advisory Given   Pulmonary sleep apnea and Continuous Positive Airway Pressure Ventilation ,          Cardiovascular hypertension, Pt. on medications and Pt. on home beta blockers Rhythm:Regular Rate:Normal     Neuro/Psych  Neuromuscular disease    GI/Hepatic GERD-  Medicated,  Endo/Other    Renal/GU      Musculoskeletal   Abdominal   Peds  Hematology   Anesthesia Other Findings   Reproductive/Obstetrics                          Anesthesia Physical Anesthesia Plan  ASA: III  Anesthesia Plan: General   Post-op Pain Management:    Induction: Intravenous  Airway Management Planned: Oral ETT  Additional Equipment:   Intra-op Plan:   Post-operative Plan: Extubation in OR  Informed Consent: I have reviewed the patients History and Physical, chart, labs and discussed the procedure including the risks, benefits and alternatives for the proposed anesthesia with the patient or authorized representative who has indicated his/her understanding and acceptance.   Dental advisory given  Plan Discussed with: CRNA, Surgeon and Anesthesiologist  Anesthesia Plan Comments:        Anesthesia Quick Evaluation

## 2012-08-06 NOTE — H&P (Signed)
Kathyrn Drown   DOB:  18-Mar-1941    HISTORY OF PRESENT ILLNESS:  The patient is a 71 year old, right-handed white male who presented for a second opinion, neurosurgical consultation regarding advanced multilevel lumbar degenerative changes.  The patient explains that over the past year and a half to two years he has been having increasing symptoms of neurogenic claudication.  He describes both aching low back pain as well as radicular pain extending into the iliac crest and lateral hips bilaterally.  He explains that initially his symptoms were somewhat more prominent on the right side but are now present bilaterally.    He also found that he has become increasingly limited in his walking.  He previously has been walking two miles every day. At this point, he can at best walk about a quarter of a mile and that requires him to walk as little as 100 feet before he has to rest because he develops intense fatigue into the hips and thighs bilaterally.  That intense fatigue resolves with rest and he is able to resume walking again a short distance before he has to rest once again.  Overall his claudication symptoms have been steadily worsening.   Over the last couple of years, he has undergone a number of different injections including SI joint injections, bilateral L4-5 injections, and bilateral L1 injections.  He found that the L4-5 injections and the L1 injections all helped temporarily but had no lasting effect.    Overall he finds that he sleeps well.    However because of this pain, he has been having to use Vicodin more regularly.  He finds that he is most comfortable first thing in the morning.  He does use Mobic 15 mg. q.a.m. and this seems to hold him until about the middle of the day when he has to take his first 7.5 mg. Vicodin and then often later toward the evening he will have to take another 7.5 mg. Vicodin.    He was referred to Dr. Stasia Cavalier and her PA, Geraldine Contras for neurosurgical  consultation.  They had him undergo a myelogram and post myelogram CT scan, and after he recontacted them 2-3 weeks later he was told that they did not favor surgery.  He, therefore, sought a second opinion and consultation.    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 He does not describe a history of myocardial infarction, cancer, stroke, diabetes, peptic ulcer disease or lung disease.  Previous surgery includes a C4-C6 anterior cervical diskectomy and fusion in [place] in December 2006, right hip replacement in [place] in March of 2007, and left hip replacement in March 2012.    He denies allergies to medications.  Current medications include Hydrochlorothiazide 25 mg. q.d., Metoprolol 25 mg. q.d., Lisinopril 20 mg. q.d., Meloxicam 15 mg. q.d., Finasteride 5 mg. q.d., Omeprazole 20 mg. q.d., Aspirin 81 mg. q.d., Tamsulosin 0.4 mg. q.d., Zolpidem 10 mg. q.d., Hydrocodone 7.5/325 b.i.d. and calcium citrate.    FAMILY HISTORY:  Parents have passed on.  There is a family history of diabetes, heart disease and stroke.  SOCIAL HISTORY:  The patient is retired.  He does not smoke.  He drinks alcoholic beverages daily.  He denies a history of substance abuse. He typically has only one alcoholic beverage each day.    REVIEW OF SYSTEMS:  Notable for those  difficulties described in the History of Present Illness and Past Medical History but the 14-point Review of Systems sheet is otherwise unremarkable.   PHYSICAL EXAMINATION:  The patient is a well developed, well nourished white male in no acute distress.  Ht. 6'1". Wt. 225 pounds.  He is afebrile.  Lungs are clear to auscultation.  He has symmetrical respiratory excursion.  Heart has a regular rate and rhythm.  Normal S1 and S2, no murmurs.  Extremity examination shows no clubbing, cyanosis but there  is bilateral pitting edema moderate in extent, slightly worsened on the right than the left side.  Musculoskeletal examination shows no tenderness to palpation over the lumbar spinous processes or paralumbar musculature.  He is able to flex to about 80 degrees and able to extend to beyond 10 degrees.  Straight leg raise is negative bilaterally.    NEUROLOGICAL EXAMINATION:  Shows 5/5 strength through the lower extremities including the iliopsoas, quadriceps, dorsiflexors, extensor hallucis longus and plantar flexors bilaterally.  Sensation is intact to pin prick in the feet bilaterally.  Reflexes are 2 at the quadriceps and gastrocnemii and symmetrical bilaterally.  Toes are downgoing bilaterally.  He has a normal gait and stance.    DIAGNOSTIC STUDIES:  X-rays were done today at the office and we reviewed those in conjunction with his myelogram and post myelogram CT scan reviewed by CD-ROM disk from [facility].  STUDY:  Lumbar spine series. VIEWS:  AP, lateral, flexion, extension, obliques, cone-down lumbosacral junction and AP thoracic (7 lumbar and 1 thoracic view).   INDICATIONS:  Neurogenic claudication. FINDINGS:  The patient has 12 thoracic type vertebrae.  He has 5 lumbar type vertebrae with multilevel degenerative disc disease and spondylosis.  We see particularly disc space narrowing at L1-2 with vacuum disc phenomenon, endplate sclerosis and ventral and lateral spurring.  We see a dynamic degenerative spondylolisthesis grade 1 of L4 on 5 secondary to advanced bilateral hypertrophic facet arthropathy.    Myelogram and post myelogram CT scan reconfirms multilevel degenerative disc disease and spondylosis seen at 5 levels including L1-2, L2-3, L3-4, L4-5 and L5-S1.  At L1-2 there is advanced degenerative disc disease and spondylosis.  There is moderate multifactorial lumbar stenosis.  At L2-3, there is circumferential disc bulging and mild canal stenosis. At L3-4, there is  circumferential disc bulging with some calcified disc protrusion, hypertrophic facet arthropathy and mild to moderate canal stenosis. At L4-5, there is grade 1 spondylolisthesis, there is moderate to marked multifactorial lumbar stenosis, and there is markedly hypertrophic advanced facet arthropathy.  At L5-S1, there is degenerative disc disease and spondylosis, possible ankylosis of that level.  The canal though is widely patent.    IMPRESSION:   Patient with symptoms that are most consistent with neurogenic claudication with five level lumbar degenerative changes with the most severe change is seen at the L4-5, next most so at L1-2 but also notable at the L3-4 level.  Neurologically good strength and sensation.  RECOMMENDATIONS:  I discussed my assessment and impression with the patient and reviewed his x-rays and myelogram and post myelogram CT scan with him.  He did have an MRI done at [facility] but did not bring a CD of that study and therefore we did not have that for review.    We discussed with him the options of continued symptomatic treatment versus possible surgical intervention.  Unfortunately he has becoming increasingly disabled, becoming more and more limited in his ability to walk and get around, and  certainly much more limited in his exercise activities.    In the end, I believe he is most likely symptomatic primarily from the stenosis at L4-5 but we cannot exclude that there is some contribution to his symptoms from the degenerative changes at the L1-2 level.  We discussed the alternatives of surgical intervention.  For surgery, I would favor an L3 to L5 decompressive lumbar laminectomy and an L3-4 and L4-5 PLIF and PLA with interbody implants, posterior instrumentation and bone graft understanding that there is a chance that at some later date he might require surgery at the L1-2 and/or L2-3 levels.    I discussed the nature of the surgical procedure, the typical length of surgery,  hospital stay and overall recuperation and limitations postoperatively, the need for postop immobilization in a lumbar brace, and the risks of surgery including risk of infection, bleeding, possible need for transfusion, risk of nerve dysfunction, pain, weakness, paresthesias, the risk of failure of the arthrodesis and possible need for further surgery, the risk of dural tear and CSF leakage with possible need for further surgery, and anesthetic complications including myocardial infarction, stroke, pneumonia and death.    Understanding all of this, he does want to proceed with surgery.   We did go ahead and fit him for an extra large [employer] with abdominal support to help provide his low back with support during the postop period and to reduce his pain and discomfort.    [facility]        Hewitt Shorts, M.D.

## 2012-08-07 NOTE — Progress Notes (Signed)
Filed Vitals:   08/07/12 0301 08/07/12 0500 08/07/12 0532 08/07/12 0951  BP: 120/58  144/58 125/54  Pulse: 88  78 80  Temp: 97.6 F (36.4 C)  98.3 F (36.8 C) 97.6 F (36.4 C)  TempSrc: Oral  Oral Oral  Resp: 12  14 18   Height:  6\' 1"  (1.854 m)    Weight:  99.791 kg (220 lb)    SpO2: 96%  96% 97%     Patient resting comfortably in bed, has been sitting up in chair for a couple of hours prior to this. Feels his have a bowel movement, but so far has been unable to have one. Was given milk of magnesia, so far no response. We'll change IV to saline lock. Will DC Foley. Encouraged to ambulate in the halls.  Plan: Continue to progress through postop recovery.  Hewitt Shorts, MD 08/07/2012, 10:30 AM

## 2012-08-08 LAB — URINALYSIS, ROUTINE W REFLEX MICROSCOPIC
Bilirubin Urine: NEGATIVE
Glucose, UA: NEGATIVE mg/dL
Ketones, ur: 15 mg/dL — AB
Leukocytes, UA: NEGATIVE
Nitrite: NEGATIVE
Protein, ur: NEGATIVE mg/dL
Specific Gravity, Urine: 1.02 (ref 1.005–1.030)
Urobilinogen, UA: 0.2 mg/dL (ref 0.0–1.0)
pH: 5 (ref 5.0–8.0)

## 2012-08-08 LAB — URINE MICROSCOPIC-ADD ON

## 2012-08-08 MED FILL — Sodium Chloride IV Soln 0.9%: INTRAVENOUS | Qty: 1000 | Status: AC

## 2012-08-08 MED FILL — Heparin Sodium (Porcine) Inj 1000 Unit/ML: INTRAMUSCULAR | Qty: 30 | Status: AC

## 2012-08-08 MED FILL — Sodium Chloride Irrigation Soln 0.9%: Qty: 3000 | Status: AC

## 2012-08-08 NOTE — Progress Notes (Signed)
Pt. Not emptying bladder; voiding ~200cc; bladder scan shows 526cc. Pt. In and out cathed for 500cc clear, yellow urine. Pt. Tolerated well. Will monitor.

## 2012-08-08 NOTE — Progress Notes (Signed)
Pt. Has been unable to void at all since last in and out cath. Bladder Scan indicates 396cc; pt. In and out cathed for 400cc amber urine. Pt. Tolerated well; will monitor.

## 2012-08-08 NOTE — Progress Notes (Signed)
RN was notified of abnormal BP  

## 2012-08-08 NOTE — Progress Notes (Signed)
Foley Cath placed per orders; UA and Cx sent. Pt tolerated well.   FYI: pt.'s urologist is  Dr. Sabino Gasser in [facility].

## 2012-08-08 NOTE — Progress Notes (Signed)
Filed Vitals:   08/07/12 1451 08/07/12 1854 08/07/12 2114 08/08/12 0216  BP: 119/51 111/49 127/57 126/48  Pulse: 87 77 85 75  Temp: 98.2 F (36.8 C) 98.3 F (36.8 C) 99.1 F (37.3 C) 98.8 F (37.1 C)  TempSrc: Oral Oral Oral Oral  Resp: 18 18 18 17   Height:      Weight:      SpO2: 97% 98% 99% 100%    Patient reasonably comfortable. Up and living in halls. Dressing clean and dry. Having mild to moderate urinary retention, required in and out catheterization earlier this morning. Already on Flomax and Proscar. Encouraged to ambulate.  Plan: We'll continue to monitor voiding function. Nursing staff to remove dressing.  Hewitt Shorts, MD 08/08/2012, 9:29 AM

## 2012-08-09 LAB — URINE CULTURE
Colony Count: NO GROWTH
Culture: NO GROWTH

## 2012-08-09 MED ORDER — OXYCODONE-ACETAMINOPHEN 5-325 MG PO TABS: 1.0000 | ORAL_TABLET | ORAL | Status: AC | PRN

## 2012-08-09 NOTE — Plan of Care (Signed)
Problem: Discharge Progression Outcomes Goal: Pain controlled with appropriate interventions Outcome: Completed/Met Date Met:  08/09/12 Patient reports some pain relief with current ordered medication.

## 2012-08-09 NOTE — Progress Notes (Signed)
Reviewed discharged instructions with wife.  Questions answered regarding foley care.  Referred wife to literature received in discharge packet.

## 2012-08-09 NOTE — Discharge Summary (Signed)
Physician Discharge Summary  Patient ID: Vincent Rivas MRN: [id number] DOB/AGE: Jul 04, 1941 71 y.o.  Admit date: 08/06/2012 Discharge date: 08/09/2012  Admission Diagnoses:  Lumbar stenosis, lumbar spondylolisthesis, lumbar spondylosis, lumbar degenerative disc disease  Discharge Diagnoses:    Lumbar stenosis, lumbar spondylolisthesis, lumbar spondylosis, lumbar degenerative disc disease, urinary retention  Discharged Condition: good  Hospital Course: Patient was admitted, underwent date L3-L5 decompressive lumbar laminectomy, bilateral L3-4 and L4-5 PLIF and PLA. Postoperatively he has done well he's been up and ambulating actively. His wound is clean dry, and healing nicely. However he has had difficulties with urinary retention. He required multiple in and out catheterizations, and eventually replaced the Foley catheter, and he is being discharged home with a leg bag, and instructions in its use. He does have a history of BPH, and has been on Proscar and Flomax and is followed by Dr. Tobie Lords, his urologist at [facility] in [place]. The patient has been instructed to followup with Dr. Sabino Gasser, his PA, or one of his partners on September 3 or fourth. The patient is to followup with me in 3 weeks. We've given him instructions regarding wound care and activities following discharge.  Discharge Exam: Blood pressure 120/62, pulse 78, temperature 97.8 F (36.6 C), temperature source Oral, resp. rate 18, height 6\' 1"  (1.854 m), weight 99.791 kg (220 lb), SpO2 99.00%.  Disposition: Home   Medication List  As of 08/09/2012 10:53 AM   TAKE these medications         aspirin 81 MG tablet   Take 81 mg by mouth daily.      calcium carbonate 600 MG Tabs   Commonly known as: OS-CAL   Take 600 mg by mouth.      finasteride 5 MG tablet   Commonly known as: PROSCAR   Take 5 mg by mouth daily.      hydrochlorothiazide 25 MG tablet   Commonly known as: HYDRODIURIL   Take 25 mg by mouth  daily.      HYDROcodone-acetaminophen 5-325 MG per tablet   Commonly known as: NORCO/VICODIN   Take 1 tablet by mouth every 6 (six) hours as needed. For pain      lisinopril 20 MG tablet   Commonly known as: PRINIVIL,ZESTRIL   Take 20 mg by mouth daily.      meloxicam 15 MG tablet   Commonly known as: MOBIC   Take 15 mg by mouth daily as needed. For pain      metoprolol succinate 25 MG 24 hr tablet   Commonly known as: TOPROL-XL   Take 25 mg by mouth daily.      omeprazole 20 MG capsule   Commonly known as: PRILOSEC   Take 20 mg by mouth daily.      oxyCODONE-acetaminophen 5-325 MG per tablet   Commonly known as: PERCOCET/ROXICET   Take 1-2 tablets by mouth every 4 (four) hours as needed for pain.      Tamsulosin HCl 0.4 MG Caps   Commonly known as: FLOMAX   Take 0.4 mg by mouth daily after supper.      zolpidem 10 MG tablet   Commonly known as: AMBIEN   Take 10 mg by mouth at bedtime as needed. For pain             Signed: Hewitt Shorts, MD 08/09/2012, 10:53 AM

## 2012-08-09 NOTE — Plan of Care (Signed)
Problem: Discharge Progression Outcomes Goal: Tolerates diet Outcome: Completed/Met Date Met:  08/09/12 Patient to return to pre-hospital diet.

## 2012-08-09 NOTE — Plan of Care (Signed)
Problem: Discharge Progression Outcomes Goal: Incision without S/S infection Outcome: Completed/Met Date Met:  08/09/12 Discussed s/s of infection related to incision.

## 2012-08-09 NOTE — Plan of Care (Signed)
Problem: Discharge Progression Outcomes Goal: Ambulates without assistance Outcome: Completed/Met Date Met:  08/09/12 Patient uses a walker at home so will just have to be reminded to wear his brace when he is up and moving.

## 2012-08-12 NOTE — Care Management Note (Signed)
    Page 1 of 1   08/12/2012     8:13:38 AM   CARE MANAGEMENT NOTE 08/12/2012  Patient:  Vincent Rivas, Vincent Rivas   Account Number:  [id number]  Date Initiated:  08/07/2012  Documentation initiated by:  [facility]  Subjective/Objective Assessment:   Admitted postop PLIF L3-5.Lives with spouse     Action/Plan:   Anticipated DC Date:  08/10/2012   Anticipated DC Plan:  HOME/SELF CARE         Choice offered to / List presented to:             Status of service:  Completed, signed off Medicare Important Message given?   (If response is "NO", the following Medicare IM given date fields will be blank) Date Medicare IM given:   Date Additional Medicare IM given:    Discharge Disposition:  HOME/SELF CARE  Per UR Regulation:  Reviewed for med. necessity/level of care/duration of stay  If discussed at Long Length of Stay Meetings, dates discussed:    Comments:  08/07/12 Spoke with patient and his wife about discharge needs, he has a rolling walker,rollator and hadicap accessable bathroom.  Patient states that he has been walking in the hall with minimal assist with RN today. Patient's wife states that they take care of each other. No d/c needs identified. Will continue to follow. Jacquelynn Cree RN, BSN, CCM

## 2014-08-11 ENCOUNTER — Other Ambulatory Visit: Payer: Self-pay | Admitting: Neurosurgery

## 2014-08-11 DIAGNOSIS — M47816 Spondylosis without myelopathy or radiculopathy, lumbar region: Secondary | ICD-10-CM

## 2014-08-13 ENCOUNTER — Ambulatory Visit
Admission: RE | Admit: 2014-08-13 | Discharge: 2014-08-13 | Disposition: A | Discharge status: Home / Self Care | Payer: Medicare Other | Source: Ambulatory Visit | Attending: Neurosurgery | Admitting: Neurosurgery

## 2014-08-13 DIAGNOSIS — M47816 Spondylosis without myelopathy or radiculopathy, lumbar region: Secondary | ICD-10-CM

## 2016-12-22 ENCOUNTER — Encounter (HOSPITAL_BASED_OUTPATIENT_CLINIC_OR_DEPARTMENT_OTHER): Payer: Self-pay | Admitting: Emergency Medicine

## 2016-12-22 ENCOUNTER — Emergency Department (HOSPITAL_BASED_OUTPATIENT_CLINIC_OR_DEPARTMENT_OTHER): Payer: Medicare HMO

## 2016-12-22 ENCOUNTER — Emergency Department (HOSPITAL_BASED_OUTPATIENT_CLINIC_OR_DEPARTMENT_OTHER)
Admission: EM | Admit: 2016-12-22 | Discharge: 2016-12-23 | Disposition: A | Discharge status: Home / Self Care | Payer: Medicare HMO | Attending: Emergency Medicine | Admitting: Emergency Medicine

## 2016-12-22 DIAGNOSIS — Z87891 Personal history of nicotine dependence: Secondary | ICD-10-CM | POA: Diagnosis not present

## 2016-12-22 DIAGNOSIS — Z79899 Other long term (current) drug therapy: Secondary | ICD-10-CM | POA: Insufficient documentation

## 2016-12-22 DIAGNOSIS — J40 Bronchitis, not specified as acute or chronic: Secondary | ICD-10-CM | POA: Insufficient documentation

## 2016-12-22 DIAGNOSIS — I10 Essential (primary) hypertension: Secondary | ICD-10-CM | POA: Diagnosis not present

## 2016-12-22 DIAGNOSIS — R05 Cough: Secondary | ICD-10-CM | POA: Diagnosis present

## 2016-12-22 LAB — URINALYSIS, MICROSCOPIC (REFLEX)

## 2016-12-22 LAB — URINALYSIS, ROUTINE W REFLEX MICROSCOPIC
Bilirubin Urine: NEGATIVE
Glucose, UA: NEGATIVE mg/dL
Ketones, ur: NEGATIVE mg/dL
Leukocytes, UA: NEGATIVE
Nitrite: NEGATIVE
Protein, ur: NEGATIVE mg/dL
Specific Gravity, Urine: 1.019 (ref 1.005–1.030)
pH: 6 (ref 5.0–8.0)

## 2016-12-22 LAB — CBG MONITORING, ED: GLUCOSE-CAPILLARY: 78 mg/dL (ref 65–99)

## 2016-12-22 MED ORDER — PREDNISONE 20 MG PO TABS: 40.0000 mg | ORAL_TABLET | Freq: Every day | ORAL | 0 refills | Status: DC

## 2016-12-22 MED ORDER — ALBUTEROL SULFATE HFA 108 (90 BASE) MCG/ACT IN AERS
2.0000 | INHALATION_SPRAY | Freq: Once | RESPIRATORY_TRACT | Status: AC
  Administered 2016-12-22: 2 via RESPIRATORY_TRACT
  Filled 2016-12-22: qty 6.7

## 2016-12-22 MED ORDER — PREDNISONE 10 MG PO TABS
60.0000 mg | ORAL_TABLET | Freq: Once | ORAL | Status: AC
  Administered 2016-12-22: 23:00:00 60 mg via ORAL
  Filled 2016-12-22: qty 1

## 2016-12-22 MED ORDER — AEROCHAMBER PLUS FLO-VU MEDIUM MISC
1.0000 | Freq: Once | Status: AC
  Administered 2016-12-22: 1
  Filled 2016-12-22: qty 1

## 2016-12-22 NOTE — ED Triage Notes (Signed)
Pt states that since last night he has had a cough and started frequent urination today.

## 2016-12-22 NOTE — ED Provider Notes (Signed)
[facility] Note   CSN: [id number] Arrival date & time: 12/22/16  2020  By signing my name below, I, Nelwyn Salisbury, attest that this documentation has been prepared under the direction and in the presence of Raeford Razor, MD . Electronically Signed: Nelwyn Salisbury, Scribe. 12/22/2016. 9:42 PM.  History   Chief Complaint Chief Complaint  Patient presents with  . Cough   The history is provided by the patient. No language interpreter was used.    HPI Comments:  Vincent Rivas is a 76 y.o. male with pmhx of GERD and HTN who presents to the Emergency Department complaining of gradual-onset, frequent cough onset yesterday afternoon. He describes a sensation of his diaphragm spasming as if he needed to cough. Pt notes his cough is worse at rest and during deep inhalation. He reports associated upper abdominal pain, rhinorrhea, sore throat, nausea, fatigue and light-headedness. Pt denies any myalgias, headaches, or lower extremity swelling.   Past Medical History:  Diagnosis Date  . Arthritis   . GERD (gastroesophageal reflux disease)   . Hypertension   . Sleep apnea     There are no active problems to display for this patient.   Past Surgical History:  Procedure Laterality Date  . CERVICAL SPINE SURGERY     2006  . JOINT REPLACEMENT     2007 RT HIP REPLACEMENT,  2012LTHR        Home Medications    Prior to Admission medications   Medication Sig Start Date End Date Taking? Authorizing Provider  Celecoxib (CELEBREX PO) Take 20 mg by mouth.   Yes Historical Provider, MD  aspirin 81 MG tablet Take 81 mg by mouth daily.    Historical Provider, MD  calcium carbonate (OS-CAL) 600 MG TABS Take 600 mg by mouth.    Historical Provider, MD  finasteride (PROSCAR) 5 MG tablet Take 5 mg by mouth daily.    Historical Provider, MD  hydrochlorothiazide (HYDRODIURIL) 25 MG tablet Take 25 mg by mouth daily.    Historical Provider, MD  HYDROcodone-acetaminophen  (NORCO/VICODIN) 5-325 MG per tablet Take 1 tablet by mouth every 6 (six) hours as needed. For pain    Historical Provider, MD  lisinopril (PRINIVIL,ZESTRIL) 20 MG tablet Take 20 mg by mouth daily.    Historical Provider, MD  meloxicam (MOBIC) 15 MG tablet Take 15 mg by mouth daily as needed. For pain    Historical Provider, MD  metoprolol succinate (TOPROL-XL) 25 MG 24 hr tablet Take 25 mg by mouth daily.    Historical Provider, MD  omeprazole (PRILOSEC) 20 MG capsule Take 20 mg by mouth daily.    Historical Provider, MD  Tamsulosin HCl (FLOMAX) 0.4 MG CAPS Take 0.4 mg by mouth daily after supper.    Historical Provider, MD  zolpidem (AMBIEN) 10 MG tablet Take 10 mg by mouth at bedtime as needed. For pain    Historical Provider, MD    Family History History reviewed. No pertinent family history.  Social History Social History  Substance Use Topics  . Smoking status: Former Games developer  . Smokeless tobacco: Never Used  . Alcohol use Yes     Comment: WINE  4 OZ     Allergies   Patient has no known allergies.   Review of Systems Review of Systems  Constitutional: Positive for fatigue.  HENT: Positive for rhinorrhea and sore throat.   Respiratory: Positive for cough.   Cardiovascular: Negative for leg swelling.  Gastrointestinal: Positive for abdominal pain and nausea.  Musculoskeletal: Negative for myalgias.  Neurological: Positive for light-headedness. Negative for headaches.  All other systems reviewed and are negative.    Physical Exam Updated Vital Signs BP (!) 143/51   Pulse 83   Temp 99.7 F (37.6 C) (Oral)   Resp 18   Ht 6\' 1"  (1.854 m)   Wt 230 lb (104.3 kg)   SpO2 100%   BMI 30.34 kg/m   Physical Exam  Constitutional: He is oriented to person, place, and time. He appears well-developed and well-nourished.  HENT:  Head: Normocephalic and atraumatic.  Eyes: EOM are normal.  Neck: Normal range of motion.  Cardiovascular: Normal rate, regular rhythm, normal  heart sounds and intact distal pulses.   Pulmonary/Chest: Effort normal and breath sounds normal. No respiratory distress.  Frequent coughing. Can speak in complete sentences.   Abdominal: Soft. He exhibits no distension. There is no tenderness.  Musculoskeletal: Normal range of motion.  Neurological: He is alert and oriented to person, place, and time.  Skin: Skin is warm and dry.  Psychiatric: He has a normal mood and affect. Judgment normal.  Nursing note and vitals reviewed.   ED Treatments / Results  DIAGNOSTIC STUDIES:  Oxygen Saturation is 100% on RA, normal by my interpretation.    COORDINATION OF CARE:  10:49 PM Discussed treatment plan with pt at bedside which includes a prescription for inhaled abx and instructions to fill the prescription should his symptoms not improve. Pt agreed to plan.   Labs (all labs ordered are listed, but only abnormal results are displayed) Labs Reviewed - No data to display  EKG  EKG Interpretation None       Radiology Dg Chest 2 View  Result Date: 12/22/2016 CLINICAL DATA:  Chest pain, cough, congestion, fever, and shortness of breath x 2 days. Increased urination. Ex smoker. Hypertension. EXAM: CHEST  2 VIEW COMPARISON:  07/30/2012 FINDINGS: Normal heart size and pulmonary vascularity. No focal airspace disease or consolidation in the lungs. No blunting of costophrenic angles. No pneumothorax. Mediastinal contours appear intact. Degenerative changes in the spine and shoulders. Postoperative changes in the cervical spine. No change since prior study. IMPRESSION: No active cardiopulmonary disease. Electronically Signed   By: Burman Nieves M.D.   On: 12/22/2016 21:04    Procedures Procedures (including critical care time)  Medications Ordered in ED Medications - No data to display   Initial Impression / Assessment and Plan / ED Course  I have reviewed the triage vital signs and the nursing notes.  Pertinent labs & imaging results  that were available during my care of the patient were reviewed by me and considered in my medical decision making (see chart for details).  Clinical Course     75yM with likely viral bronchitis. Bronchodilators. Steroids. It has been determined that no acute conditions requiring further emergency intervention are present at this time. The patient has been advised of the diagnosis and plan. I reviewed any labs and imaging including any potential incidental findings. We have discussed signs and symptoms that warrant return to the ED and they are listed in the discharge instructions.    Final Clinical Impressions(s) / ED Diagnoses   Final diagnoses:  Bronchitis    New Prescriptions Discharge Medication List as of 12/22/2016 11:05 PM    START taking these medications   Details  predniSONE (DELTASONE) 20 MG tablet Take 2 tablets (40 mg total) by mouth daily., Starting Sat 12/22/2016, Print        I personally preformed  the services scribed in my presence. The recorded information has been reviewed is accurate. Raeford RazorStephen Naven Giambalvo, MD.     Raeford RazorStephen Lindberg Zenon, MD 01/02/17 [phone]

## 2016-12-22 NOTE — ED Notes (Signed)
Pt reports dry cough since yesterday that has prevented him from sleeping.  Pt states he has been urinating with a lot more frequency and volume that he normally does despite not increasing his fluid intake.  Pt also reports some dizziness upon standing and general fatigue for past week.

## 2021-12-17 ENCOUNTER — Other Ambulatory Visit: Payer: Self-pay

## 2021-12-17 ENCOUNTER — Emergency Department (HOSPITAL_BASED_OUTPATIENT_CLINIC_OR_DEPARTMENT_OTHER): Payer: Medicare HMO

## 2021-12-17 ENCOUNTER — Encounter (HOSPITAL_BASED_OUTPATIENT_CLINIC_OR_DEPARTMENT_OTHER): Payer: Self-pay | Admitting: Emergency Medicine

## 2021-12-17 ENCOUNTER — Emergency Department (HOSPITAL_BASED_OUTPATIENT_CLINIC_OR_DEPARTMENT_OTHER)
Admission: EM | Admit: 2021-12-17 | Discharge: 2021-12-17 | Disposition: A | Discharge status: Home / Self Care | Payer: Medicare HMO | Attending: Emergency Medicine | Admitting: Emergency Medicine

## 2021-12-17 DIAGNOSIS — W01198A Fall on same level from slipping, tripping and stumbling with subsequent striking against other object, initial encounter: Secondary | ICD-10-CM | POA: Insufficient documentation

## 2021-12-17 DIAGNOSIS — S6992XA Unspecified injury of left wrist, hand and finger(s), initial encounter: Secondary | ICD-10-CM | POA: Diagnosis present

## 2021-12-17 DIAGNOSIS — S41012A Laceration without foreign body of left shoulder, initial encounter: Secondary | ICD-10-CM | POA: Diagnosis not present

## 2021-12-17 DIAGNOSIS — I10 Essential (primary) hypertension: Secondary | ICD-10-CM | POA: Insufficient documentation

## 2021-12-17 DIAGNOSIS — S0990XA Unspecified injury of head, initial encounter: Secondary | ICD-10-CM | POA: Diagnosis not present

## 2021-12-17 DIAGNOSIS — S46012A Strain of muscle(s) and tendon(s) of the rotator cuff of left shoulder, initial encounter: Secondary | ICD-10-CM

## 2021-12-17 DIAGNOSIS — Z79899 Other long term (current) drug therapy: Secondary | ICD-10-CM | POA: Insufficient documentation

## 2021-12-17 DIAGNOSIS — Z7982 Long term (current) use of aspirin: Secondary | ICD-10-CM | POA: Diagnosis not present

## 2021-12-17 DIAGNOSIS — S62115A Nondisplaced fracture of triquetrum [cuneiform] bone, left wrist, initial encounter for closed fracture: Secondary | ICD-10-CM | POA: Diagnosis not present

## 2021-12-17 DIAGNOSIS — W19XXXA Unspecified fall, initial encounter: Secondary | ICD-10-CM

## 2021-12-17 MED ORDER — FENTANYL CITRATE PF 50 MCG/ML IJ SOSY
50.0000 ug | PREFILLED_SYRINGE | Freq: Once | INTRAMUSCULAR | Status: AC
  Administered 2021-12-17: 50 ug via INTRAMUSCULAR
  Filled 2021-12-17: qty 1

## 2021-12-17 MED ORDER — CYCLOBENZAPRINE HCL 10 MG PO TABS: 10.0000 mg | ORAL_TABLET | Freq: Two times a day (BID) | ORAL | 0 refills | Status: DC | PRN

## 2021-12-17 MED ORDER — OXYCODONE HCL 5 MG PO TABS: 5.0000 mg | ORAL_TABLET | ORAL | 0 refills | Status: DC | PRN

## 2021-12-17 NOTE — ED Triage Notes (Addendum)
Pt fell on concrete driveway, landing on LT side; c/o pain to entire LUE, unable to lift arm; also hit back of head; not on blood thinners; denies LOC before or after fall; took Vicodin x 1 at home 1 hr ago

## 2021-12-17 NOTE — ED Notes (Signed)
Patient transported to X-ray 

## 2021-12-17 NOTE — ED Provider Notes (Signed)
[facility] EMERGENCY DEPARTMENT Provider Note   CSN: [id number] Arrival date & time: 12/17/21  1258     History  Chief Complaint  Patient presents with   Vincent ConradFall    Vincent Rivas is a 81 y.o. male.  HPI     81 year old male with a history of hypertension, arthritis, sleep apnea presents with concern for mechanical fall with left shoulder, elbow and wrist pain.  Reports that he ended up falling due to tripping with his dog in the driveway.  He did hit the back of his head, but denies loss of consciousness.  Denies significant headache, nausea, vomiting, numbness, weakness with exception of having some tingling of the thumb index and middle fingers.  Denies chest pain, shortness of breath, abdominal pain, injuries to his legs, difficulty walking.  Denies neck or back pain.  Reports he has severe pain in his left shoulder, and due to this pain he is not able to raise it.  He also reports severe.  To the left wrist, moderate pain in his elbow.  He improved with norco of his wife's that he took prior to arrival   Past Medical History:  Diagnosis Date   Arthritis    GERD (gastroesophageal reflux disease)    Hypertension    Sleep apnea      Home Medications Prior to Admission medications   Medication Sig Start Date End Date Taking? Authorizing Provider  cyclobenzaprine (FLEXERIL) 10 MG tablet Take 1 tablet (10 mg total) by mouth 2 (two) times daily as needed for muscle spasms. 12/17/21  Yes Alvira MondaySchlossman, Tallan Sandoz, MD  oxyCODONE (ROXICODONE) 5 MG immediate release tablet Take 1 tablet (5 mg total) by mouth every 4 (four) hours as needed for severe pain. 12/17/21  Yes Alvira MondaySchlossman, Linnie Mcglocklin, MD  aspirin 81 MG tablet Take 81 mg by mouth daily.    [provider]  calcium carbonate (OS-CAL) 600 MG TABS Take 600 mg by mouth.    [provider]  Celecoxib (CELEBREX PO) Take 20 mg by mouth.    [provider]  finasteride (PROSCAR) 5 MG tablet Take 5 mg by mouth daily.     [provider]  hydrochlorothiazide (HYDRODIURIL) 25 MG tablet Take 25 mg by mouth daily.    [provider]  HYDROcodone-acetaminophen (NORCO/VICODIN) 5-325 MG per tablet Take 1 tablet by mouth every 6 (six) hours as needed. For pain    [provider]  lisinopril (PRINIVIL,ZESTRIL) 20 MG tablet Take 20 mg by mouth daily.    [provider]  meloxicam (MOBIC) 15 MG tablet Take 15 mg by mouth daily as needed. For pain    [provider]  metoprolol succinate (TOPROL-XL) 25 MG 24 hr tablet Take 25 mg by mouth daily.    [provider]  omeprazole (PRILOSEC) 20 MG capsule Take 20 mg by mouth daily.    [provider]  predniSONE (DELTASONE) 20 MG tablet Take 2 tablets (40 mg total) by mouth daily. 12/22/16   Raeford RazorKohut, Stephen, MD  Tamsulosin HCl (FLOMAX) 0.4 MG CAPS Take 0.4 mg by mouth daily after supper.    [provider]  zolpidem (AMBIEN) 10 MG tablet Take 10 mg by mouth at bedtime as needed. For pain    [provider]      Allergies    Patient has no known allergies.    Review of Systems   Review of Systems  Physical Exam Updated Vital Signs BP (!) 116/51    Pulse Marland Kitchen(!)  56    Temp 97.6 F (36.4 C) (Oral)    Resp 16    Ht 5\' 11"  (1.803 m)    Wt 99.8 kg    SpO2 95%    BMI 30.68 kg/m  Physical Exam Vitals and nursing note reviewed.  Constitutional:      General: He is not in acute distress.    Appearance: He is well-developed. He is not diaphoretic.  HENT:     Head: Normocephalic.  Eyes:     Conjunctiva/sclera: Conjunctivae normal.  Cardiovascular:     Rate and Rhythm: Normal rate and regular rhythm.     Heart sounds: Normal heart sounds. No murmur heard.   No friction rub. No gallop.  Pulmonary:     Effort: Pulmonary effort is normal. No respiratory distress.     Breath sounds: Normal breath sounds. No wheezing or rales.  Abdominal:     General: There is no distension.     Palpations: Abdomen is  soft.     Tenderness: There is no abdominal tenderness. There is no guarding.  Musculoskeletal:        General: Tenderness present.     Cervical back: Normal range of motion.     Comments: No clavicle tenderness, no midline c/t/l spine tenderness Tenderness to lateral and anterior shoulder Able to abduct shoulder to about 15 degrees but can lift no more than that with severe pain in left shoulder, normal flexion and extenion at elbow, does report elbow tenderness Tenderness to ulnar side of hand/wrist Normal finger abduction, opponens, able to flex extend at wrist    Skin:    General: Skin is warm and dry.  Neurological:     Mental Status: He is alert and oriented to person, place, and time.    ED Results / Procedures / Treatments   Labs (all labs ordered are listed, but only abnormal results are displayed) Labs Reviewed - No data to display  EKG None  Radiology DG Elbow Complete Left  Result Date: 12/17/2021 CLINICAL DATA:  Left elbow pain after a fall. EXAM: LEFT ELBOW - COMPLETE 3+ VIEW COMPARISON:  None. FINDINGS: There is no evidence of fracture, dislocation, or joint effusion. There is no evidence of arthropathy or other focal bone abnormality. Soft tissues are unremarkable. IMPRESSION: Negative. Electronically Signed   By: Obie DredgeWilliam T Derry M.D.   On: 12/17/2021 14:52   DG Wrist Complete Left  Result Date: 12/17/2021 CLINICAL DATA:  Left wrist pain after fall. EXAM: LEFT WRIST - COMPLETE 3+ VIEW COMPARISON:  None. FINDINGS: Dorsal carpal bony irregularity on the lateral view with overlying soft tissue swelling. Normal alignment. Radiocarpal, first CMC, and distal radioulnar joint osteoarthritis. Chondrocalcinosis at the TFCC. Bone mineralization is normal. IMPRESSION: 1. Dorsal carpal bony irregularity with overlying soft tissue swelling could reflect a triquetral fracture or be degenerative. Correlate with point tenderness. Electronically Signed   By: Obie DredgeWilliam T Derry M.D.   On:  12/17/2021 14:51   CT Head Wo Contrast  Result Date: 12/17/2021 CLINICAL DATA:  Head trauma, abnormal mental status. Patient fell on concrete driveway. EXAM: CT HEAD WITHOUT CONTRAST TECHNIQUE: Contiguous axial images were obtained from the base of the skull through the vertex without intravenous contrast. COMPARISON:  None. FINDINGS: Brain: No evidence of acute infarction, hemorrhage, hydrocephalus, extra-axial collection or mass lesion/mass effect. Scattered area of low attenuation of the white matter presumed chronic microvascular ischemic changes. Vascular: No hyperdense vessel or unexpected calcification. Skull: Normal. Negative for fracture or focal lesion. Sinuses/Orbits: No  acute finding. Other: None. IMPRESSION: No acute intracranial abnormality. Electronically Signed   By: Larose Hires D.O.   On: 12/17/2021 14:21   CT Cervical Spine Wo Contrast  Result Date: 12/17/2021 CLINICAL DATA:  Fall, left upper extremity pain, unable to lift arm EXAM: CT CERVICAL SPINE WITHOUT CONTRAST TECHNIQUE: Multidetector CT imaging of the cervical spine was performed without intravenous contrast. Multiplanar CT image reconstructions were also generated. COMPARISON:  None. FINDINGS: Alignment: Normal. Skull base and vertebrae: No acute fracture. There are chronic degenerative fragments about the atlantoaxial articulation (series 7, image 13). No primary bone lesion or focal pathologic process. Soft tissues and spinal canal: No prevertebral fluid or swelling. No visible canal hematoma. Disc levels: Anterior cervical discectomy and fusion of C4 through C6. Additional ankylosis of C3-C4. Moderate to severe disc space height loss and osteophytosis of the remaining disc spaces at C6-C7 and C7-T1. C2-C3 is intact. Upper chest: Negative. Other: None. IMPRESSION: 1. No fracture or static subluxation of the cervical spine. Chronic, corticated degenerative fragments about the atlantoaxial articulation. 2. Anterior cervical  discectomy and fusion of C4 through C6. Additional ankylosis of C3-C4. 3. Moderate to severe disc space height loss and osteophytosis of the remaining disc spaces at C6-C7 and C7-T1. Electronically Signed   By: Jearld Lesch M.D.   On: 12/17/2021 14:26   DG Shoulder Left  Result Date: 12/17/2021 CLINICAL DATA:  Left shoulder pain after fall. EXAM: LEFT SHOULDER - 2+ VIEW COMPARISON:  None. FINDINGS: No acute fracture or dislocation. Mild acromioclavicular and glenohumeral joint space narrowing with small marginal osteophytes. Soft tissues are unremarkable. IMPRESSION: No acute osseous abnormality. Electronically Signed   By: Obie Dredge M.D.   On: 12/17/2021 14:45    Procedures Procedures    Medications Ordered in ED Medications  fentaNYL (SUBLIMAZE) injection 50 mcg (50 mcg Intramuscular Given 12/17/21 1436)    ED Course/ Medical Decision Making/ A&P                           Medical Decision Making   81 year old male with a history of hypertension, arthritis, sleep apnea presents with concern for mechanical fall with left shoulder, elbow and wrist pain.  Images personally reviewed and interpreted by myself and radiology. CT head done given age and history of head trauma shows no evidence of intracranial hemorrhage.  CT cervical spine done given age and distracting injury shows no evidence of acute cervical spine abnormality.  No sign of other acute medical concerns on history or exam.  X-rays of the left shoulder and elbow show no acute abnormalities.  X-ray of the left wrist shows possible triquetrium fracture, and he does have tenderness over this area.  We will treat as possible acute fracture, placed in volar splint and recommend follow-up with hand surgeon on-call Dr. Aundria Rud.  Has significant pain with moving left shoulder and inability to fully abduct it consistent with rotator cuff tear.  Given the significant pain and abnormality focal to the shoulder have low suspicion for  CVA.  Placed in shoulder sling and recommend follow-up also with orthopedics.  Reviewed in [place] drug database, gave prescription for oxycodone for breakthrough pain and Flexeril as a muscle relaxant.        Final Clinical Impression(s) / ED Diagnoses Final diagnoses:  Fall  Traumatic complete tear of left rotator cuff, initial encounter  Closed nondisplaced fracture of triquetrum of left wrist, initial encounter    Rx / DC Orders  ED Discharge Orders          Ordered    cyclobenzaprine (FLEXERIL) 10 MG tablet  2 times daily PRN        12/17/21 1520    oxyCODONE (ROXICODONE) 5 MG immediate release tablet  Every 4 hours PRN        12/17/21 1520              Alvira Monday, MD 12/18/21 [phone]

## 2022-05-31 ENCOUNTER — Emergency Department (HOSPITAL_BASED_OUTPATIENT_CLINIC_OR_DEPARTMENT_OTHER): Payer: Medicare HMO

## 2022-05-31 ENCOUNTER — Other Ambulatory Visit: Payer: Self-pay

## 2022-05-31 ENCOUNTER — Observation Stay (HOSPITAL_BASED_OUTPATIENT_CLINIC_OR_DEPARTMENT_OTHER)
Admission: EM | Admit: 2022-05-31 | Discharge: 2022-05-31 | Disposition: A | Discharge status: Home / Self Care | Payer: Medicare HMO | Attending: Internal Medicine | Admitting: Internal Medicine

## 2022-05-31 ENCOUNTER — Other Ambulatory Visit (HOSPITAL_COMMUNITY): Payer: Self-pay

## 2022-05-31 ENCOUNTER — Other Ambulatory Visit: Payer: Self-pay | Admitting: Physician Assistant

## 2022-05-31 ENCOUNTER — Observation Stay (HOSPITAL_BASED_OUTPATIENT_CLINIC_OR_DEPARTMENT_OTHER): Payer: Medicare HMO

## 2022-05-31 ENCOUNTER — Encounter (HOSPITAL_BASED_OUTPATIENT_CLINIC_OR_DEPARTMENT_OTHER): Payer: Self-pay | Admitting: Emergency Medicine

## 2022-05-31 DIAGNOSIS — G4733 Obstructive sleep apnea (adult) (pediatric): Secondary | ICD-10-CM | POA: Diagnosis present

## 2022-05-31 DIAGNOSIS — Z96643 Presence of artificial hip joint, bilateral: Secondary | ICD-10-CM | POA: Diagnosis not present

## 2022-05-31 DIAGNOSIS — Z7982 Long term (current) use of aspirin: Secondary | ICD-10-CM | POA: Insufficient documentation

## 2022-05-31 DIAGNOSIS — Z87891 Personal history of nicotine dependence: Secondary | ICD-10-CM | POA: Diagnosis not present

## 2022-05-31 DIAGNOSIS — I480 Paroxysmal atrial fibrillation: Secondary | ICD-10-CM | POA: Diagnosis present

## 2022-05-31 DIAGNOSIS — I4891 Unspecified atrial fibrillation: Principal | ICD-10-CM | POA: Insufficient documentation

## 2022-05-31 DIAGNOSIS — Z79899 Other long term (current) drug therapy: Secondary | ICD-10-CM | POA: Insufficient documentation

## 2022-05-31 DIAGNOSIS — G894 Chronic pain syndrome: Secondary | ICD-10-CM | POA: Diagnosis present

## 2022-05-31 DIAGNOSIS — F39 Unspecified mood [affective] disorder: Secondary | ICD-10-CM | POA: Diagnosis present

## 2022-05-31 DIAGNOSIS — G8929 Other chronic pain: Secondary | ICD-10-CM | POA: Diagnosis not present

## 2022-05-31 DIAGNOSIS — I499 Cardiac arrhythmia, unspecified: Secondary | ICD-10-CM | POA: Diagnosis present

## 2022-05-31 DIAGNOSIS — I10 Essential (primary) hypertension: Secondary | ICD-10-CM | POA: Insufficient documentation

## 2022-05-31 HISTORY — DX: Chronic prostatitis: N41.1

## 2022-05-31 LAB — BASIC METABOLIC PANEL
Anion gap: 11 (ref 5–15)
BUN: 23 mg/dL (ref 8–23)
CO2: 23 mmol/L (ref 22–32)
Calcium: 9.1 mg/dL (ref 8.9–10.3)
Chloride: 100 mmol/L (ref 98–111)
Creatinine, Ser: 1.03 mg/dL (ref 0.61–1.24)
GFR, Estimated: >60 mL/min (ref >60)
Glucose, Bld: 130 mg/dL — ABNORMAL HIGH (ref 70–99)
Potassium: 4.5 mmol/L (ref 3.5–5.1)
Sodium: 134 mmol/L — ABNORMAL LOW (ref 135–145)

## 2022-05-31 LAB — ECHOCARDIOGRAM COMPLETE
AR max vel: 2.48 cm2
AV Area VTI: 2.49 cm2
AV Area mean vel: 2.45 cm2
AV Mean grad: 8 mmHg
AV Peak grad: 12.8 mmHg
Ao pk vel: 1.79 m/s
Area-P 1/2: 3.28 cm2
Calc EF: 60.3 %
Height: 71 in
P 1/2 time: 576 msec
S' Lateral: 4 cm
Single Plane A2C EF: 57.7 %
Single Plane A4C EF: 63.4 %
Weight: 3648 oz

## 2022-05-31 LAB — CBC WITH DIFFERENTIAL/PLATELET
Abs Immature Granulocytes: 0.03 10*3/uL (ref 0.00–0.07)
Basophils Absolute: 0.1 10*3/uL (ref 0.0–0.1)
Basophils Relative: 1 %
Eosinophils Absolute: 1.4 10*3/uL — ABNORMAL HIGH (ref 0.0–0.5)
Eosinophils Relative: 13 %
HCT: 42.3 % (ref 39.0–52.0)
Hemoglobin: 14.2 g/dL (ref 13.0–17.0)
Immature Granulocytes: 0 %
Lymphocytes Relative: 19 %
Lymphs Abs: 2.1 10*3/uL (ref 0.7–4.0)
MCH: 27.5 pg (ref 26.0–34.0)
MCHC: 33.6 g/dL (ref 30.0–36.0)
MCV: 82 fL (ref 80.0–100.0)
Monocytes Absolute: 1 10*3/uL (ref 0.1–1.0)
Monocytes Relative: 9 %
Neutro Abs: 6.5 10*3/uL (ref 1.7–7.7)
Neutrophils Relative %: 58 %
Platelets: 333 10*3/uL (ref 150–400)
RBC: 5.16 MIL/uL (ref 4.22–5.81)
RDW: 13.2 % (ref 11.5–15.5)
WBC: 11 10*3/uL — ABNORMAL HIGH (ref 4.0–10.5)
nRBC: 0 % (ref 0.0–0.2)

## 2022-05-31 MED ORDER — ONDANSETRON HCL 4 MG/2ML IJ SOLN: 4.0000 mg | Freq: Four times a day (QID) | INTRAMUSCULAR | Status: DC | PRN

## 2022-05-31 MED ORDER — DILTIAZEM HCL-DEXTROSE 125-5 MG/125ML-% IV SOLN (PREMIX)
5.0000 mg/h | INTRAVENOUS | Status: DC
  Administered 2022-05-31: 5 mg/h via INTRAVENOUS
  Filled 2022-05-31: qty 125

## 2022-05-31 MED ORDER — ENOXAPARIN SODIUM 120 MG/0.8ML IJ SOSY
1.0000 mg/kg | PREFILLED_SYRINGE | Freq: Once | INTRAMUSCULAR | Status: AC
  Administered 2022-05-31: 102 mg via SUBCUTANEOUS
  Filled 2022-05-31: qty 0.8

## 2022-05-31 MED ORDER — LISINOPRIL 20 MG PO TABS: 20.0000 mg | ORAL_TABLET | Freq: Every day | ORAL | Status: DC

## 2022-05-31 MED ORDER — DILTIAZEM HCL ER COATED BEADS 120 MG PO CP24
120.0000 mg | ORAL_CAPSULE | Freq: Every day | ORAL | Status: DC
  Administered 2022-05-31: 120 mg via ORAL
  Filled 2022-05-31: qty 1

## 2022-05-31 MED ORDER — APIXABAN 5 MG PO TABS
5.0000 mg | ORAL_TABLET | Freq: Two times a day (BID) | ORAL | 1 refills | Status: DC
  Filled 2022-05-31: qty 60, 30d supply, fill #0

## 2022-05-31 MED ORDER — APIXABAN 5 MG PO TABS
5.0000 mg | ORAL_TABLET | Freq: Two times a day (BID) | ORAL | Status: DC
Start: 2022-05-31 — End: 2022-05-31

## 2022-05-31 MED ORDER — PANTOPRAZOLE SODIUM 40 MG PO TBEC
40.0000 mg | DELAYED_RELEASE_TABLET | Freq: Every day | ORAL | Status: DC
  Administered 2022-05-31: 40 mg via ORAL
  Filled 2022-05-31: qty 1

## 2022-05-31 MED ORDER — DULOXETINE HCL 60 MG PO CPEP
60.0000 mg | ORAL_CAPSULE | Freq: Every day | ORAL | Status: DC
Start: 2022-05-31 — End: 2022-05-31
  Administered 2022-05-31: 60 mg via ORAL
  Filled 2022-05-31: qty 1

## 2022-05-31 MED ORDER — DILTIAZEM LOAD VIA INFUSION
20.0000 mg | Freq: Once | INTRAVENOUS | Status: AC
  Administered 2022-05-31: 20 mg via INTRAVENOUS
  Filled 2022-05-31: qty 20

## 2022-05-31 MED ORDER — METOPROLOL TARTRATE 25 MG PO TABS: 25.0000 mg | ORAL_TABLET | Freq: Two times a day (BID) | ORAL | Status: DC

## 2022-05-31 MED ORDER — GABAPENTIN 100 MG PO CAPS: 100.0000 mg | ORAL_CAPSULE | Freq: Three times a day (TID) | ORAL | Status: DC | PRN

## 2022-05-31 MED ORDER — ONDANSETRON HCL 4 MG PO TABS: 4.0000 mg | ORAL_TABLET | Freq: Four times a day (QID) | ORAL | Status: DC | PRN

## 2022-05-31 MED ORDER — SODIUM CHLORIDE 0.9% FLUSH
3.0000 mL | Freq: Two times a day (BID) | INTRAVENOUS | Status: DC
  Administered 2022-05-31: 3 mL via INTRAVENOUS

## 2022-05-31 MED ORDER — DILTIAZEM HCL ER COATED BEADS 120 MG PO CP24
120.0000 mg | ORAL_CAPSULE | Freq: Every day | ORAL | 1 refills | Status: DC
  Filled 2022-05-31: qty 30, 30d supply, fill #0

## 2022-05-31 MED ORDER — ACETAMINOPHEN 325 MG PO TABS: 650.0000 mg | ORAL_TABLET | Freq: Four times a day (QID) | ORAL | Status: DC | PRN

## 2022-05-31 MED ORDER — ACETAMINOPHEN 650 MG RE SUPP: 650.0000 mg | Freq: Four times a day (QID) | RECTAL | Status: DC | PRN

## 2022-05-31 MED ORDER — DILTIAZEM HCL 25 MG/5ML IV SOLN
10.0000 mg | Freq: Once | INTRAVENOUS | Status: AC
  Administered 2022-05-31: 10 mg via INTRAVENOUS

## 2022-05-31 MED ORDER — HYDRALAZINE HCL 20 MG/ML IJ SOLN: 5.0000 mg | INTRAMUSCULAR | Status: DC | PRN

## 2022-05-31 MED ORDER — METHOCARBAMOL 500 MG PO TABS
500.0000 mg | ORAL_TABLET | Freq: Four times a day (QID) | ORAL | Status: DC | PRN
Start: 2022-05-31 — End: 2022-05-31

## 2022-05-31 NOTE — Progress Notes (Addendum)
Dr. Antoine Poche requested 30 day event monitor. Msg sent to monitor team and order placed. Patient notified by phone into room to expect in the mail. Also put this as FYI on his AVS as well. He confirmed address is correct. He reports his home number is no longer accurate - replaced with correct mobile [phone].   Called pt on phone with echo result. Per d/w MD have also arranged 8 week post monitor f/u with [facility]. Appt info placed on AVS and primary attending updated as well.

## 2022-05-31 NOTE — TOC Initial Note (Signed)
Transition of Care [facility]) - Initial/Assessment Note    Patient Details  Name: Vincent Rivas MRN: [id number] Date of Birth: 1941-11-08  Transition of Care [facility]) CM/SW Contact:    Durenda Guthrie, RN Phone Number: 05/31/2022, 10:20 AM  Clinical Narrative:                 Transition of Care Screening Note: Transition of Care Department [facility]) has reviewed patient and no TOC needs have been identified at this time. We will continue to monitor patient advancement through Interdisciplinary progressions. If new patient transition needs arise, please place a consult.          Patient Goals and CMS Choice        Expected Discharge Plan and Services                                                Prior Living Arrangements/Services                       Activities of Daily Living      Permission Sought/Granted                  Emotional Assessment              Admission diagnosis:  Atrial fibrillation with rapid ventricular response [facility]) [I48.91] Patient Active Problem List   Diagnosis Date Noted   Atrial fibrillation with rapid ventricular response (HCC) 05/31/2022   PCP:  Roberts Gaudy., MD Pharmacy:   [facility] [phone] - [place], [place] - [address] AT [facility] [address] [place] [address] Phone: [phone] Fax: [phone]     Social Determinants of Health (SDOH) Interventions    Readmission Risk Interventions     No data to display

## 2022-05-31 NOTE — Assessment & Plan Note (Addendum)
-  Continue Lisinopril -Hold HCTZ, lopressor and amlodipine for now -Start Cardizem CD 120 mg daily

## 2022-05-31 NOTE — Discharge Instructions (Addendum)
Information on my medicine - ELIQUIS (apixaban)  This medication education was reviewed with me or my healthcare representative as part of my discharge preparation.  The pharmacist that spoke with me during my hospital stay was:  Mosetta Anis, [facility]  Why was Eliquis prescribed for you? Eliquis was prescribed for you to reduce the risk of a blood clot forming that can cause a stroke if you have a medical condition called atrial fibrillation (a type of irregular heartbeat).  What do You need to know about Eliquis ? Take your Eliquis TWICE DAILY - one tablet in the morning and one tablet in the evening with or without food. If you have difficulty swallowing the tablet whole please discuss with your pharmacist how to take the medication safely.  Take Eliquis exactly as prescribed by your doctor and DO NOT stop taking Eliquis without talking to the doctor who prescribed the medication.  Stopping may increase your risk of developing a stroke.  Refill your prescription before you run out.  After discharge, you should have regular check-up appointments with your healthcare provider that is prescribing your Eliquis.  In the future your dose may need to be changed if your kidney function or weight changes by a significant amount or as you get older.  What do you do if you miss a dose? If you miss a dose, take it as soon as you remember on the same day and resume taking twice daily.  Do not take more than one dose of ELIQUIS at the same time to make up a missed dose.  Important Safety Information A possible side effect of Eliquis is bleeding. You should call your healthcare provider right away if you experience any of the following: Bleeding from an injury or your nose that does not stop. Unusual colored urine (red or dark brown) or unusual colored stools (red or black). Unusual bruising for unknown reasons. A serious fall or if you hit your head (even if there is no bleeding).  Some  medicines may interact with Eliquis and might increase your risk of bleeding or clotting while on Eliquis. To help avoid this, consult your healthcare provider or pharmacist prior to using any new prescription or non-prescription medications, including herbals, vitamins, non-steroidal anti-inflammatory drugs (NSAIDs) and supplements.  This website has more information on Eliquis (apixaban): http://www.eliquis.com/eliquis/home    --------------------------   ATRIAL FIBRILLATION CLINIC   You have an appointment set up with the Atrial Fibrillation Clinic in August.  Multiple studies have shown that being followed by a dedicated atrial fibrillation clinic in addition to the standard care you receive from your other physicians improves health. We believe that enrollment in the atrial fibrillation clinic will allow [place] to better care for you.   The phone number to the Atrial Fibrillation Clinic is [phone]. The clinic is staffed Monday through Friday from 8:30am to 5pm.  Parking Directions: The clinic is located in the Heart and Vascular Building connected to [facility]. 1) From [facility] turn on to [address] and go to the 3rd entrance  (Heart and Vascular entrance) on the right. Valet parking is available. 2) If self-parking, look to the right for Heart &Vascular Parking Garage. A code for the parking garage entrance is required - the code for the month of August is 1403. 3) Take the elevators to the 1st floor. Registration is in the room with the glass walls at the end of the hallway.  If you have any trouble parking or locating  the clinic, please don't hesitate to call [phone].

## 2022-05-31 NOTE — Assessment & Plan Note (Addendum)
-  Patient presenting with new-onset afib.  -He was in RVR but was started on Cardizem and converted spontaneously, currently in NSR -Etiology is thought to be related to HTN, uncontrolled OSA.  -He was transferred from [facility] to [facility] for observation on telemetry -Echo with preserved EF and grade 1 diastolic dysfunction but otherwise unremarkable per cardiology -Cardiology has consulted and recommends 4-week event monitor; they will arrange f/u for him in 7-8 weeks -Heart rate is well controlled, now in NSR; HR is 70 currently -Dr. Antoine Poche has recommended stopping amlodipine and starting Cardizem CD 120 mg daily -He does have an Apple Watch (wasn't wearing it) and has been encouraged to use it -CHA2DS2-VASc Score is >2 and so patient would benefit from oral anticoagulation.  -TOC team consult for cost analysis of DOAC therapy; brief search of [employer] appears to indicate that Eliquis is reasonable so will order at this time (TOC agrees that this is reasonable from a cost standpoint).

## 2022-05-31 NOTE — TOC Benefit Eligibility Note (Signed)
Patient Advocate Encounter ? ?Insurance verification completed.   ? ?The patient is currently admitted and upon discharge could be taking Eliquis 5 mg. ? ?The current 30 day co-pay is, $47.00.  ? ?The patient is insured through [employer] Medicare Part D  ? ? ? ?Lillyann Ahart, CPhT ?Pharmacy Patient Advocate Specialist ?[facility] Patient Advocate Team ?Direct Number: [phone]  Fax: [phone] ? ? ? ? ? ?  ?

## 2022-05-31 NOTE — Progress Notes (Signed)
Echocardiogram 2D Echocardiogram has been performed.  Rodrigo Ran 05/31/2022, 1:31 PM

## 2022-05-31 NOTE — Assessment & Plan Note (Signed)
-  Does not wear home CPAP, which may increase his risk for afib -Needs new sleep study -Outpatient f/u encouraged

## 2022-05-31 NOTE — Consult Note (Signed)
Cardiology Consultation:   Patient ID: Vincent Rivas; [id number]; 1941-07-04   Admit date: 05/31/2022 Date of Consult: 05/31/2022  Primary Care Provider: Roberts Gaudy., MD Primary Cardiologist: None  Primary Electrophysiologist:  None   Patient Profile:   Vincent Rivas is a 81 y.o. male with a hx of hypertension and SOB  who is being seen today for the evaluation of atrial fib  at the request of Dr. Ophelia Charter.  History of Present Illness:   Mr. Cull has no prior cardiac history.  He presented with acute shortness of breath.  He has never had any prior cardiac history.  He did notice he was little fatigued going to physical therapy for the shoulder yesterday.  He was a little fatigued walking the dog Mallow.  Last night he was more tired and went to bed at 10:00.  He did not notice his heart racing or skipping but he was tossing and turning and then reached up and felt his pulse around 3 in the morning and noted it to be fast and irregular. In the emergency room he was noted to be in atrial fibrillation.  Heart rate was in the 130s to 150s.  He was treated with IV of diltiazem.  He was given Lovenox at therapeutic dose.  At some point in time he did convert to sinus rhythm.  He otherwise says he stays active walking the dog and just did okay with the shoulder surgery this spring. The patient denies any new symptoms such as chest discomfort, neck or arm discomfort. There has been no new shortness of breath, PND or orthopnea. There have been no reported palpitations, presyncope or syncope.  Past Medical History:  Diagnosis Date   Arthritis    Chronic prostatitis    also with BPH   GERD (gastroesophageal reflux disease)    Hypertension    Sleep apnea    does not wear CPAP    Past Surgical History:  Procedure Laterality Date   CERVICAL SPINE SURGERY     2006   JOINT REPLACEMENT     2007 RT HIP REPLACEMENT,  2012LTHR    JOINT REPLACEMENT Left    HIP   LUMBAR SPINE SURGERY      ROTATOR CUFF REPAIR Bilateral 04/05/2022   also in 2020     Home Medications:  Prior to Admission medications   Medication Sig Start Date End Date Taking? Authorizing Provider  acetaminophen (TYLENOL) 650 MG CR tablet Take 1,300 mg by mouth 2 (two) times daily as needed (neck/shoulder pain).   Yes [provider]  amLODipine (NORVASC) 5 MG tablet Take 5 mg by mouth daily. 03/05/22  Yes [provider]  DULoxetine (CYMBALTA) 60 MG capsule Take 60 mg by mouth daily. 03/26/22  Yes [provider]  hydrochlorothiazide (HYDRODIURIL) 25 MG tablet Take 25 mg by mouth daily.   Yes [provider]  lisinopril (PRINIVIL,ZESTRIL) 20 MG tablet Take 20 mg by mouth daily.   Yes [provider]  meloxicam (MOBIC) 7.5 MG tablet Take 7.5 mg by mouth 2 (two) times daily as needed for pain. 04/14/22  Yes [provider]  methocarbamol (ROBAXIN) 500 MG tablet Take 500 mg by mouth 4 (four) times daily as needed for muscle spasms. 04/27/22  Yes [provider]  metoprolol tartrate (LOPRESSOR) 25 MG tablet Take 25 mg by mouth 2 (two) times daily. 05/16/22  Yes [provider]  naloxone (NARCAN) nasal spray 4 mg/0.1 mL Place 4 mg into the  nose as needed. 04/06/22  Yes [provider]  omeprazole (PRILOSEC) 40 MG capsule Take 40 mg by mouth daily. 03/05/22  Yes [provider]  triamcinolone cream (KENALOG) 0.1 % Apply 1 Application topically 2 (two) times daily. 05/11/22  Yes [provider]    Inpatient Medications: Scheduled Meds:  apixaban  5 mg Oral BID   diltiazem  120 mg Oral Daily   DULoxetine  60 mg Oral Daily   [START ON 06/01/2022] lisinopril  20 mg Oral Daily   pantoprazole  40 mg Oral Daily   sodium chloride flush  3 mL Intravenous Q12H   Continuous Infusions:  PRN Meds: acetaminophen **OR** acetaminophen, gabapentin, hydrALAZINE, methocarbamol, ondansetron **OR** ondansetron (ZOFRAN) IV  Allergies:     Allergies  Allergen Reactions   Neurontin [Gabapentin] Other (See Comments)    Extreme fatigue     Social History:   Social History   Socioeconomic History   Marital status: Married    Spouse name: Not on file   Number of children: Not on file   Years of education: Not on file   Highest education level: Not on file  Occupational History   Not on file  Tobacco Use   Smoking status: Former    Packs/day: 1.50    Years: 10.00    Total pack years: 15.00    Types: Cigarettes   Smokeless tobacco: Never  Vaping Use   Vaping Use: Never used  Substance and Sexual Activity   Alcohol use: Yes    Alcohol/week: 7.0 standard drinks of alcohol    Types: 7 Glasses of wine per week    Comment: WINE  4 OZ + tonic water   Drug use: No   Sexual activity: Not Currently  Other Topics Concern   Not on file  Social History Narrative   Smoked in his twenties.  Retired Best boy for [employer].  Two children.  Lives with wife.     Social Determinants of Health   Financial Resource Strain: Not on file  Food Insecurity: Not on file  Transportation Needs: Not on file  Physical Activity: Not on file  Stress: Not on file  Social Connections: Not on file  Intimate Partner Violence: Not on file    Family History:    Family History  Problem Relation Age of Onset   Diabetes Mother    CAD Mother 55   Kidney failure Father        Complications of a severe burn     ROS:  Please see the history of present illness.   All other ROS reviewed and negative.     Physical Exam/Data:   Vitals:   05/31/22 0700 05/31/22 0715 05/31/22 0800 05/31/22 0922  BP: 124/73 134/69 120/66 (!) 137/53  Pulse: 64 (!) 48 74 73  Resp: 14 18 18 17   Temp:    (!) 97.5 F (36.4 C)  TempSrc:    Oral  SpO2: 95% 97% 95% 97%  Weight:      Height:       No intake or output data in the 24 hours ending 05/31/22 1241 Filed Weights   05/31/22 0525  Weight: 103.4 kg   Body mass index is 31.8 kg/m.  GENERAL: Well  appearing HEENT:   Pupils equal round and reactive, fundi not visualized, oral mucosa unremarkable NECK:  No  jugular venous distention, waveform within normal limits, carotid upstroke brisk and symmetric, no bruits, no thyromegaly LYMPHATICS:  No cervical, inguinal adenopathy LUNGS:  Clear to auscultation bilaterally BACK:  No CVA tenderness CHEST:   Unremarkable HEART:  PMI not displaced or sustained,S1 and S2 within normal limits, no S3, no S4, no clicks, no rubs, no murmurs ABD:  Flat, positive bowel sounds normal in frequency in pitch, no bruits, no rebound, no guarding, no midline pulsatile mass, no hepatomegaly, no splenomegaly EXT:  2 plus pulses throughout, no  edema, no cyanosis no clubbing SKIN:  No rashes no nodules NEURO:   Cranial nerves II through XII grossly intact, motor grossly intact throughout PSYCH:    Cognitively intact, oriented to person place and time   EKG:  The EKG was personally reviewed and demonstrates: Normal sinus rhythm no arrhythmias Telemetry:  Telemetry was personally reviewed and demonstrates: Normal sinus rhythm, rate 64, axis within normal limits, intervals within normal limits, no acute ST-T wave changes.  Relevant CV Studies: Echo pending  Laboratory Data:  Chemistry Recent Labs  Lab 05/31/22 0532  NA 134*  K 4.5  CL 100  CO2 23  GLUCOSE 130*  BUN 23  CREATININE 1.03  CALCIUM 9.1  GFRNONAA >60  ANIONGAP 11    No results for input(s): "PROT", "ALBUMIN", "AST", "ALT", "ALKPHOS", "BILITOT" in the last 168 hours. Hematology Recent Labs  Lab 05/31/22 0532  WBC 11.0*  RBC 5.16  HGB 14.2  HCT 42.3  MCV 82.0  MCH 27.5  MCHC 33.6  RDW 13.2  PLT 333   Cardiac EnzymesNo results for input(s): "TROPONINI" in the last 168 hours. No results for input(s): "TROPIPOC" in the last 168 hours.  BNPNo results for input(s): "BNP", "PROBNP" in the last 168 hours.  DDimer No results for input(s): "DDIMER" in the last 168  hours.  Radiology/Studies:  DG Chest Port 1 View  Result Date: 05/31/2022 CLINICAL DATA:  Atrial fibrillation with rapid ventricular response EXAM: PORTABLE CHEST 1 VIEW COMPARISON:  12/22/2016 FINDINGS: Borderline heart size accentuated by technique. Negative aortic and hilar contours. There is no edema, consolidation, effusion, or pneumothorax. Artifact from EKG leads. IMPRESSION: No evidence of active disease. Electronically Signed   By: Tiburcio Pea M.D.   On: 05/31/2022 06:31    Assessment and Plan:   ATRIAL FIB WITH RVR: The patient had paroxysmal atrial fibrillation.Mr. ARMISTEAD SULT has a CHA2DS2 - VASc score of 3.   He has no contraindication to Eliquis.  He will be started on this.  I will stop his amlodipine.  He can probably continue his lisinopril.  I will start him on Cardizem CD 120 mg daily.  We are going to send him a 4-week monitor.  He also has an Scientist, physiological and will learn how to use it.  We talked about monitoring himself.  He can follow-up with me in with atrial fibrillation clinic.  Further management will be based on the burden of fibrillation.  Of note echo is pending and should be done before his discharge and if not we will arrange for this to be done as an outpatient.  TSH is pending and we will follow-up on this result.  HTN: Blood pressure is controlled.  This will be managed as above.   For questions or updates, please contact [facility] Please consult [email or web address] for contact info under Cardiology/STEMI.   Signed, Rollene Rotunda, MD  05/31/2022 12:41 PM

## 2022-05-31 NOTE — ED Provider Notes (Signed)
[place] DEPT [facility] Provider Note: Vincent Spurling, MD, FACEP  CSN: [id number] MRN: [id number] ARRIVAL: 05/31/22 at [place]: [place]  Irregular Heart Beat   HISTORY OF PRESENT ILLNESS  05/31/22 5:31 AM Vincent Rivas is a 81 y.o. male with no history of atrial fibrillation.  He had about 4 hours of restless sleep this morning with shortness of breath and agitation.  He finally realized that his heart was beating irregularly as well as rapidly.  He felt lightheaded especially when standing but never had any chest pain.  Nevertheless he came to the ED where he was found to have a heart rate in the 130s to 150s.  EKG showed atrial fibrillation with rapid ventricular response.  Nothing is making his symptoms better or worse apart from the lightheadedness on standing or exertion.   Past Medical History:  Diagnosis Date   Arthritis    GERD (gastroesophageal reflux disease)    Hypertension    Sleep apnea     Past Surgical History:  Procedure Laterality Date   CERVICAL SPINE SURGERY     2006   JOINT REPLACEMENT     2007 RT HIP REPLACEMENT,  2012LTHR    JOINT REPLACEMENT Left    HIP   LUMBAR SPINE SURGERY      No family history on file.  Social History   Tobacco Use   Smoking status: Former   Smokeless tobacco: Never  Scientific laboratory technician Use: Never used  Substance Use Topics   Alcohol use: Yes    Comment: WINE  4 OZ   Drug use: No    Prior to Admission medications   Medication Sig Start Date End Date Taking? Authorizing Provider  aspirin 81 MG tablet Take 81 mg by mouth daily.    [provider]  calcium carbonate (OS-CAL) 600 MG TABS Take 600 mg by mouth.    [provider]  Celecoxib (CELEBREX PO) Take 20 mg by mouth.    [provider]  cyclobenzaprine (FLEXERIL) 10 MG tablet Take 1 tablet (10 mg total) by mouth 2 (two) times daily as needed for muscle spasms. 12/17/21   Gareth Morgan, MD  finasteride (PROSCAR) 5  MG tablet Take 5 mg by mouth daily.    [provider]  hydrochlorothiazide (HYDRODIURIL) 25 MG tablet Take 25 mg by mouth daily.    [provider]  HYDROcodone-acetaminophen (NORCO/VICODIN) 5-325 MG per tablet Take 1 tablet by mouth every 6 (six) hours as needed. For pain    [provider]  lisinopril (PRINIVIL,ZESTRIL) 20 MG tablet Take 20 mg by mouth daily.    [provider]  meloxicam (MOBIC) 15 MG tablet Take 15 mg by mouth daily as needed. For pain    [provider]  metoprolol succinate (TOPROL-XL) 25 MG 24 hr tablet Take 25 mg by mouth daily.    [provider]  omeprazole (PRILOSEC) 20 MG capsule Take 20 mg by mouth daily.    [provider]  oxyCODONE (ROXICODONE) 5 MG immediate release tablet Take 1 tablet (5 mg total) by mouth every 4 (four) hours as needed for severe pain. 12/17/21   Gareth Morgan, MD  predniSONE (DELTASONE) 20 MG tablet Take 2 tablets (40 mg total) by mouth daily. 12/22/16   Virgel Manifold, MD  Tamsulosin HCl (FLOMAX) 0.4 MG CAPS Take 0.4 mg by mouth daily after supper.    [provider]  zolpidem (AMBIEN) 10 MG tablet Take 10  mg by mouth at bedtime as needed. For pain    [provider]    Allergies Gabapentin   REVIEW OF SYSTEMS  Negative except as noted here or in the History of Present Illness.   PHYSICAL EXAMINATION  Initial Vital Signs Blood pressure (!) 142/75, pulse (!) 135, temperature 97.7 F (36.5 C), temperature source Oral, resp. rate (!) 22, height 5\' 11"  (1.803 m), weight 103.4 kg, SpO2 99 %.  Examination General: Well-developed, well-nourished male in no acute distress; appearance consistent with age of record HENT: normocephalic; atraumatic Eyes: pupils equal, round and reactive to light; extraocular muscles intact Neck: supple Heart: Irregular rhythm; tachycardia Lungs: clear to auscultation bilaterally Abdomen: soft; nondistended; nontender; bowel  sounds present Extremities: No deformity; full range of motion; pulses normal Neurologic: Awake, alert and oriented; motor function intact in all extremities and symmetric; no facial droop Skin: Warm and dry Psychiatric: Mildly anxious   RESULTS  Summary of this visit's results, reviewed and interpreted by myself:   EKG Interpretation  Date/Time:  Thursday May 31 2022 05:22:10 EDT Ventricular Rate:  147 PR Interval:    QRS Duration: 98 QT Interval:  281 QTC Calculation: 440 R Axis:   11 Text Interpretation: Atrial fibrillation with rapid V-rate Repolarization abnormality, prob rate related Previously NSR Confirmed by 11-22-1984 (Paula Libra) on 05/31/2022 5:30:33 AM       Laboratory Studies: Results for orders placed or performed during the hospital encounter of 05/31/22 (from the past 24 hour(s))  Basic metabolic panel     Status: Abnormal   Collection Time: 05/31/22  5:32 AM  Result Value Ref Range   Sodium 134 (L) 135 - 145 mmol/L   Potassium 4.5 3.5 - 5.1 mmol/L   Chloride 100 98 - 111 mmol/L   CO2 23 22 - 32 mmol/L   Glucose, Bld 130 (H) 70 - 99 mg/dL   BUN 23 8 - 23 mg/dL   Creatinine, Ser 06/02/22 0.61 - 1.24 mg/dL   Calcium 9.1 8.9 - 2.95 mg/dL   GFR, Estimated 18.8 >41 mL/min   Anion gap 11 5 - 15  CBC with Differential     Status: Abnormal   Collection Time: 05/31/22  5:32 AM  Result Value Ref Range   WBC 11.0 (H) 4.0 - 10.5 K/uL   RBC 5.16 4.22 - 5.81 MIL/uL   Hemoglobin 14.2 13.0 - 17.0 g/dL   HCT 06/02/22 06.3 - 01.6 %   MCV 82.0 80.0 - 100.0 fL   MCH 27.5 26.0 - 34.0 pg   MCHC 33.6 30.0 - 36.0 g/dL   RDW 01.0 93.2 - 35.5 %   Platelets 333 150 - 400 K/uL   nRBC 0.0 0.0 - 0.2 %   Neutrophils Relative % 58 %   Neutro Abs 6.5 1.7 - 7.7 K/uL   Lymphocytes Relative 19 %   Lymphs Abs 2.1 0.7 - 4.0 K/uL   Monocytes Relative 9 %   Monocytes Absolute 1.0 0.1 - 1.0 K/uL   Eosinophils Relative 13 %   Eosinophils Absolute 1.4 (H) 0.0 - 0.5 K/uL   Basophils Relative 1 %    Basophils Absolute 0.1 0.0 - 0.1 K/uL   Immature Granulocytes 0 %   Abs Immature Granulocytes 0.03 0.00 - 0.07 K/uL   Imaging Studies: DG Chest Port 1 View  Result Date: 05/31/2022 CLINICAL DATA:  Atrial fibrillation with rapid ventricular response EXAM: PORTABLE CHEST 1 VIEW COMPARISON:  12/22/2016 FINDINGS: Borderline heart size accentuated by technique. Negative aortic and  hilar contours. There is no edema, consolidation, effusion, or pneumothorax. Artifact from EKG leads. IMPRESSION: No evidence of active disease. Electronically Signed   By: Tiburcio Pea M.D.   On: 05/31/2022 06:31    ED COURSE and MDM  Nursing notes, initial and subsequent vitals signs, including pulse oximetry, reviewed and interpreted by myself.  Vitals:   05/31/22 0524 05/31/22 0525 05/31/22 0600  BP: (!) 142/75  117/88  Pulse: (!) 135  (!) 101  Resp: (!) 22  17  Temp: 97.7 F (36.5 C)    TempSrc: Oral    SpO2: 99%  95%  Weight:  103.4 kg   Height:  5\' 11"  (1.803 m)    Medications  diltiazem (CARDIZEM) 1 mg/mL load via infusion 20 mg (20 mg Intravenous Bolus from Bag 05/31/22 0537)    And  diltiazem (CARDIZEM) 125 mg in dextrose 5% 125 mL (1 mg/mL) infusion (15 mg/hr Intravenous Rate/Dose Verify 05/31/22 0551)  enoxaparin (LOVENOX) injection 102 mg (has no administration in time range)   5:39 AM Cardizem load and infusion started for atrial fibrillation with rapid ventricular response.  5:56 AM Rate still not adequately controlled with Cardizem drip at 15 mg/h.  We will have patient admitted to the hospitalist service.  6:26 AM Dr. 06/02/22 accepts for admission to hospitalist service.  He requests a dose of Lovenox 1 mg/kg.   PROCEDURES  Procedures   ED DIAGNOSES     ICD-10-CM   1. Atrial fibrillation with rapid ventricular response (HCC)  I48.91          Edgardo Petrenko, Imogene Burn, MD 05/31/22 [id number]

## 2022-05-31 NOTE — ED Triage Notes (Signed)
Patient arrived via POV c/o irregular heartbeat with SHOB. Patient states this started approximately 4 hr prior to arrival. Patient states new onset. Patient is AO x 4, VS with elevated HR, slow gait.

## 2022-05-31 NOTE — Progress Notes (Signed)
Event monitor request per Dr. Antoine Poche

## 2022-05-31 NOTE — Discharge Summary (Addendum)
Physician Discharge Summary   Patient: Vincent Rivas MRN: [id number] DOB: [birth year]  Admit date:     05/31/2022  Discharge date: 05/31/22  Discharge Physician: Jonah Blue   PCP: Roberts Gaudy., MD   Recommendations at discharge:   Follow up with afib clinic as directed by cardiology Follow up with sleep study so that you can resume CPAP Wear 4-week event monitor as directed once it arrives by mail Follow up with your PCP in 1-2 weeks Take Eliquis as directed  Discharge Diagnoses: Principal Problem:   New onset atrial fibrillation (HCC) Active Problems:   Essential hypertension   OSA (obstructive sleep apnea)   Chronic pain disorder   Hospital Course: Patient was seen by cardiology and had echo which showed preserved EF and grade 1 diastolic dysfunction.  TOC has reviewed insurance coverage and Eliquis should not be cost prohibitive.  He remains in NSR.   Assessment and Plan: * New onset atrial fibrillation (HCC) -Patient presenting with new-onset afib.  -He was in RVR but was started on Cardizem and converted spontaneously, currently in NSR -Etiology is thought to be related to HTN, uncontrolled OSA.  -He was transferred from [facility] to [facility] for observation on telemetry -Echo with preserved EF and grade 1 diastolic dysfunction but otherwise unremarkable per cardiology -Cardiology has consulted and recommends 4-week event monitor; they will arrange f/u for him in 7-8 weeks -Heart rate is well controlled, now in NSR; HR is 70 currently -Dr. Antoine Poche has recommended stopping amlodipine and starting Cardizem CD 120 mg daily -He does have an Apple Watch (wasn't wearing it) and has been encouraged to use it -CHA2DS2-VASc Score is >2 and so patient would benefit from oral anticoagulation.  -TOC team consult for cost analysis of DOAC therapy; brief search of [employer] appears to indicate that Eliquis is reasonable so will order at this time (TOC agrees that this is reasonable from a  cost standpoint).  Chronic pain disorder -I have reviewed this patient in the [place] Controlled Substances Reporting System.  He is not receiving controlled medications on a routine basis -He is not at particularly high risk of opioid misuse, diversion, or overdose. -Continue Robaxin, Neurontin, Cymbalta  OSA (obstructive sleep apnea) -Does not wear home CPAP, which may increase his risk for afib -Needs new sleep study -Outpatient f/u encouraged  Essential hypertension -Continue Lisinopril -Hold HCTZ, lopressor and amlodipine for now -Start Cardizem CD 120 mg daily     Pain control - [place] Controlled Substance Reporting System database was reviewed. and patient was instructed, not to drive, operate heavy machinery, perform activities at heights, swimming or participation in water activities or provide baby-sitting services while on Pain, Sleep and Anxiety Medications; until their outpatient Physician has advised to do so again. Also recommended to not to take more than prescribed Pain, Sleep and Anxiety Medications.  Consultants: Cardiology Procedures performed: Echocardiogram  Disposition: Home Diet recommendation:  Cardiac diet DISCHARGE MEDICATION:   Follow-up Information     [facility] Follow up.   Specialty: Cardiology Why: The cardiology office will be mailing you a 30 day heart monitor to wear. It will come with instructions for use. There will be a contact number on the box if you are having any issues. You can also contact the cardiology office at this number if you have any questions. Contact information: [address] [place] [address] [phone]        [facility] R, [place] Follow up.  Specialty: Cardiology Why: We have arranged a follow-up visit at the [facility] in the [facility] on Tuesday Jul 24, 2022 at 9:00 AM. Please arrive 10-15 minutes early to check in. Please  see "Instructions" section below for more information about your visit and parking. Contact information: [address] [place] [address] [phone]                Discharge Exam: Ceasar Mons Weights   05/31/22 0525  Weight: 103.4 kg     Condition at discharge: stable  The results of significant diagnostics from this hospitalization (including imaging, microbiology, ancillary and laboratory) are listed below for reference.   Imaging Studies: ECHOCARDIOGRAM COMPLETE  Result Date: 05/31/2022    ECHOCARDIOGRAM REPORT   Patient Name:   Vincent Rivas Date of Exam: 05/31/2022 Medical Rec #:  [id number]      Height:       71.0 in Accession #:    [id number]     Weight:       228.0 lb Date of Birth:  12-29-1940      BSA:          2.229 m Patient Age:    80 years       BP:           137/53 mmHg Patient Gender: M              HR:           74 bpm. Exam Location:  Inpatient Procedure: 2D Echo, Cardiac Doppler and Color Doppler Indications:    AFIB  History:        Patient has no prior history of Echocardiogram examinations.                 Signs/Symptoms:Shortness of Breath; Risk Factors:Hypertension                 and Former Smoker.  Sonographer:    Rodrigo Ran RCS Referring Phys: [id number] Abbie Berling IMPRESSIONS  1. Left ventricular ejection fraction, by estimation, is 55 to 60%. The left ventricle has normal function. The left ventricle has no regional wall motion abnormalities. The left ventricular internal cavity size was mildly dilated. Left ventricular diastolic parameters are consistent with Grade I diastolic dysfunction (impaired relaxation).  2. Right ventricular systolic function is normal. The right ventricular size is normal.  3. The mitral valve is normal in structure. Trivial mitral valve regurgitation. No evidence of mitral stenosis.  4. The aortic valve is tricuspid. Aortic valve regurgitation is mild. Aortic valve sclerosis/calcification is present, without any evidence of aortic  stenosis.  5. The inferior vena cava is normal in size with greater than 50% respiratory variability, suggesting right atrial pressure of 3 mmHg. Comparison(s): No prior Echocardiogram. FINDINGS  Left Ventricle: Left ventricular ejection fraction, by estimation, is 55 to 60%. The left ventricle has normal function. The left ventricle has no regional wall motion abnormalities. The left ventricular internal cavity size was mildly dilated. There is  no left ventricular hypertrophy. Left ventricular diastolic parameters are consistent with Grade I diastolic dysfunction (impaired relaxation). Right Ventricle: The right ventricular size is normal. Right ventricular systolic function is normal. Left Atrium: Left atrial size was normal in size. Right Atrium: Right atrial size was normal in size. Pericardium: There is no evidence of pericardial effusion. Mitral Valve: The mitral valve is normal in structure. Mild mitral annular calcification. Trivial mitral valve regurgitation. No evidence of mitral valve  stenosis. Tricuspid Valve: The tricuspid valve is normal in structure. Tricuspid valve regurgitation is trivial. No evidence of tricuspid stenosis. Aortic Valve: The aortic valve is tricuspid. Aortic valve regurgitation is mild. Aortic regurgitation PHT measures 576 msec. Aortic valve sclerosis/calcification is present, without any evidence of aortic stenosis. Aortic valve mean gradient measures 8.0  mmHg. Aortic valve peak gradient measures 12.8 mmHg. Aortic valve area, by VTI measures 2.49 cm. Pulmonic Valve: The pulmonic valve was normal in structure. Pulmonic valve regurgitation is trivial. No evidence of pulmonic stenosis. Aorta: The aortic root is normal in size and structure. Ascending aorta measurements are within normal limits for age when indexed to body surface area. Venous: The inferior vena cava is normal in size with greater than 50% respiratory variability, suggesting right atrial pressure of 3 mmHg.  IAS/Shunts: No atrial level shunt detected by color flow Doppler.  LEFT VENTRICLE PLAX 2D LVIDd:         5.60 cm     Diastology LVIDs:         4.00 cm     LV e' medial:    5.87 cm/s LV PW:         0.80 cm     LV E/e' medial:  8.9 LV IVS:        0.70 cm     LV e' lateral:   7.07 cm/s LVOT diam:     2.20 cm     LV E/e' lateral: 7.4 LV SV:         94 LV SV Index:   42 LVOT Area:     3.80 cm  LV Volumes (MOD) LV vol d, MOD A2C: 83.3 ml LV vol d, MOD A4C: 68.0 ml LV vol s, MOD A2C: 35.2 ml LV vol s, MOD A4C: 24.9 ml LV SV MOD A2C:     48.1 ml LV SV MOD A4C:     68.0 ml LV SV MOD BP:      47.8 ml RIGHT VENTRICLE             IVC RV Basal diam:  3.00 cm     IVC diam: 1.70 cm RV Mid diam:    2.00 cm RV S prime:     15.40 cm/s TAPSE (M-mode): 2.5 cm LEFT ATRIUM             Index        RIGHT ATRIUM           Index LA diam:        3.80 cm 1.70 cm/m   RA Area:     10.60 cm LA Vol (A2C):   44.3 ml 19.87 ml/m  RA Volume:   19.00 ml  8.52 ml/m LA Vol (A4C):   23.7 ml 10.63 ml/m LA Biplane Vol: 32.4 ml 14.53 ml/m  AORTIC VALVE                     PULMONIC VALVE AV Area (Vmax):    2.48 cm      PV Vmax:          0.78 m/s AV Area (Vmean):   2.45 cm      PV Peak grad:     2.4 mmHg AV Area (VTI):     2.49 cm      PR End Diast Vel: 4.24 msec AV Vmax:           179.00 cm/s AV Vmean:  137.000 cm/s AV VTI:            0.379 m AV Peak Grad:      12.8 mmHg AV Mean Grad:      8.0 mmHg LVOT Vmax:         117.00 cm/s LVOT Vmean:        88.400 cm/s LVOT VTI:          0.248 m LVOT/AV VTI ratio: 0.65 AI PHT:            576 msec  AORTA Ao Root diam: 3.25 cm MITRAL VALVE               TRICUSPID VALVE MV Area (PHT): 3.28 cm    TR Peak grad:   27.0 mmHg MV Decel Time: 231 msec    TR Vmax:        260.00 cm/s MV E velocity: 52.30 cm/s MV A velocity: 84.00 cm/s  SHUNTS MV E/A ratio:  0.62        Systemic VTI:  0.25 m                            Systemic Diam: 2.20 cm Olga Millers MD Electronically signed by Olga Millers MD Signature  Date/Time: 05/31/2022/1:40:37 PM    Final    DG Chest Port 1 View  Result Date: 05/31/2022 CLINICAL DATA:  Atrial fibrillation with rapid ventricular response EXAM: PORTABLE CHEST 1 VIEW COMPARISON:  12/22/2016 FINDINGS: Borderline heart size accentuated by technique. Negative aortic and hilar contours. There is no edema, consolidation, effusion, or pneumothorax. Artifact from EKG leads. IMPRESSION: No evidence of active disease. Electronically Signed   By: Tiburcio Pea M.D.   On: 05/31/2022 06:31    Microbiology: Results for orders placed or performed during the hospital encounter of 08/06/12  Urine culture     Status: None   Collection Time: 08/08/12  6:56 PM   Specimen: Urine, Catheterized  Result Value Ref Range Status   Specimen Description URINE, CATHETERIZED  Final   Special Requests NONE  Final   Culture  Setup Time 08/08/2012 19:33  Final   Colony Count NO GROWTH  Final   Culture NO GROWTH  Final   Report Status 08/09/2012 FINAL  Final    Labs: CBC: Recent Labs  Lab 05/31/22 0532  WBC 11.0*  NEUTROABS 6.5  HGB 14.2  HCT 42.3  MCV 82.0  PLT 333   Basic Metabolic Panel: Recent Labs  Lab 05/31/22 0532  NA 134*  K 4.5  CL 100  CO2 23  GLUCOSE 130*  BUN 23  CREATININE 1.03  CALCIUM 9.1   Liver Function Tests: No results for input(s): "AST", "ALT", "ALKPHOS", "BILITOT", "PROT", "ALBUMIN" in the last 168 hours. CBG: No results for input(s): "GLUCAP" in the last 168 hours.  Discharge time spent: less than 30 minutes.  Signed: Jonah Blue, MD [facility] 05/31/2022

## 2022-06-06 ENCOUNTER — Ambulatory Visit (INDEPENDENT_AMBULATORY_CARE_PROVIDER_SITE_OTHER): Payer: Medicare HMO

## 2022-06-06 DIAGNOSIS — I4891 Unspecified atrial fibrillation: Secondary | ICD-10-CM | POA: Diagnosis not present

## 2022-06-15 ENCOUNTER — Telehealth (HOSPITAL_COMMUNITY): Payer: Self-pay

## 2022-06-15 ENCOUNTER — Other Ambulatory Visit (HOSPITAL_COMMUNITY): Payer: Self-pay

## 2022-06-15 NOTE — Telephone Encounter (Signed)
Pharmacy Transitions of Care Follow-up Telephone Call  Date of discharge: 05/31/22  Discharge Diagnosis: AFib  How have you been since you were released from the hospital? Patient states he has been getting a bit short of breath and is unable to do quite as much as he could before. He will be following up with his cardiologist in a week. He stated his primary care physician started him back on some of his previous blood pressure medications  and was feeling better after that.   Medication changes made at discharge: START taking these medications  START taking these medications  Cartia XT 120 MG 24 hr capsule Generic drug: diltiazem Take 1 capsule (120 mg total) by mouth daily.  Eliquis 5 MG Tabs tablet Generic drug: apixaban Take 1 tablet (5 mg total) by mouth 2 (two) times daily.   CONTINUE taking these medications  CONTINUE taking these medications  acetaminophen 650 MG CR tablet Commonly known as: TYLENOL  DULoxetine 60 MG capsule Commonly known as: CYMBALTA  lisinopril 20 MG tablet Commonly known as: ZESTRIL  omeprazole 40 MG capsule Commonly known as: PRILOSEC   STOP taking these medications  STOP taking these medications  amLODipine 5 MG tablet Commonly known as: NORVASC  hydrochlorothiazide 25 MG tablet Commonly known as: HYDRODIURIL  meloxicam 7.5 MG tablet Commonly known as: MOBIC  methocarbamol 500 MG tablet Commonly known as: ROBAXIN  metoprolol tartrate 25 MG tablet Commonly known as: LOPRESSOR  naloxone 4 MG/0.1ML Liqd nasal spray kit Commonly known as: NARCAN  triamcinolone cream 0.1 % Commonly known as: KENALOG    Medication changes verified by the patient? yes    Medication Accessibility:  Home Pharmacy: [facility]  Was the patient provided with refills on discharged medications? yes   Have all prescriptions been transferred from [facility] to home pharmacy? no    Medication Review: APIXABAN (ELIQUIS)  Apixaban 5 mg BID initiated on  6/22.  - Discussed importance of taking medication around the same time everyday  - Reviewed potential DDIs with patient  - Advised patient of medications to avoid (NSAIDs, ASA)  - Educated that Tylenol (acetaminophen) will be the preferred analgesic to prevent risk of bleeding  - Emphasized importance of monitoring for signs and symptoms of bleeding (abnormal bruising, prolonged bleeding, nose bleeds, bleeding from gums, discolored urine, black tarry stools)  - Advised patient to alert all providers of anticoagulation therapy prior to starting a new medication or having a procedure   Follow-up Appointments:  Date Visit Type Length Department   06/21/2022  3:00 PM NEW PATIENT 30 min [facility] [[id number]]    Final Patient Assessment: Patient states he has been getting a bit short of breath and is unable to do quite as much as he could before. He will be following up with his cardiologist in a week. He stated his primary care physician started him back on some of his previous blood pressure medications  and was feeling better after that.

## 2022-06-20 NOTE — Progress Notes (Unsigned)
Cardiology Office Note   Date:  06/21/2022   ID:  Vincent Rivas, DOB [birth year], MRN [id number]  PCP:  Roberts Gaudy., MD  Cardiologist:   None   Chief Complaint  Patient presents with   Atrial Fibrillation      History of Present Illness: Vincent Rivas is a 81 y.o. male who presents for follow up of atrial fib.  He has atrial fib with RVR.  I saw him in the hospital in June.  He had a normal echo.   He was discharged on Cardizem and DOAC.  He has had OSA and essential HTN.     He returns and says that he has been having some palpitations but these are nonsustained or frequent.  He is unable to be particularly active because he has cervical neck pain and might be having something done about this.  He also walks with a cane. He has not had any presyncope or syncope.  He has had no sustained symptoms such as he was having.  He has not had any new shortness of breath, PND or orthopnea.  He had no weight gain or edema.   Past Medical History:  Diagnosis Date   Arthritis    Chronic prostatitis    also with BPH   GERD (gastroesophageal reflux disease)    Hypertension    Sleep apnea    does not wear CPAP    Past Surgical History:  Procedure Laterality Date   CERVICAL SPINE SURGERY     2006   JOINT REPLACEMENT     2007 RT HIP REPLACEMENT,  [id number]    JOINT REPLACEMENT Left    HIP   LUMBAR SPINE SURGERY     ROTATOR CUFF REPAIR Bilateral 04/05/2022   also in 2020     Current Outpatient Medications  Medication Sig Dispense Refill   acetaminophen (TYLENOL) 650 MG CR tablet Take 1,300 mg by mouth 2 (two) times daily as needed (neck/shoulder pain).     apixaban (ELIQUIS) 5 MG TABS tablet Take 1 tablet (5 mg total) by mouth 2 (two) times daily. 60 tablet 1   diltiazem (CARDIZEM CD) 120 MG 24 hr capsule Take 1 capsule (120 mg total) by mouth daily. 30 capsule 1   DULoxetine (CYMBALTA) 60 MG capsule Take 60 mg by mouth daily.     hydrochlorothiazide (HYDRODIURIL) 25 MG  tablet Take by mouth.     lisinopril (PRINIVIL,ZESTRIL) 20 MG tablet Take 20 mg by mouth daily.     metoprolol tartrate (LOPRESSOR) 25 MG tablet Take by mouth in the morning and at bedtime.     omeprazole (PRILOSEC) 40 MG capsule Take 40 mg by mouth daily.     No current facility-administered medications for this visit.    Allergies:   Neurontin [gabapentin]    ROS:  Please see the history of present illness.   Otherwise, review of systems are positive for none.   All other systems are reviewed and negative.    PHYSICAL EXAM: VS:  BP 138/60 (BP Location: Left Arm, Patient Position: Sitting, Cuff Size: Normal)   Pulse 77   Ht 5\' 11"  (1.803 m)   Wt 232 lb 3.2 oz (105.3 kg)   SpO2 95%   BMI 32.39 kg/m  , BMI Body mass index is 32.39 kg/m. GENERAL:  Well appearing NECK:  No jugular venous distention, waveform within normal limits, carotid upstroke brisk and symmetric, no bruits, no thyromegaly LUNGS:  Clear to auscultation bilaterally CHEST:  Unremarkable HEART:  PMI not displaced or sustained,S1 and S2 within normal limits, no S3, no S4, no clicks, no rubs, no murmurs ABD:  Flat, positive bowel sounds normal in frequency in pitch, no bruits, no rebound, no guarding, no midline pulsatile mass, no hepatomegaly, no splenomegaly EXT:  2 plus pulses throughout, no edema, no cyanosis no clubbing   EKG:  EKG is not ordered today.    Recent Labs: 05/31/2022: BUN 23; Creatinine, Ser 1.03; Hemoglobin 14.2; Platelets 333; Potassium 4.5; Sodium 134    Lipid Panel No results found for: "CHOL", "TRIG", "HDL", "CHOLHDL", "VLDL", "LDLCALC", "LDLDIRECT"    Wt Readings from Last 3 Encounters:  06/21/22 232 lb 3.2 oz (105.3 kg)  05/31/22 228 lb (103.4 kg)  12/17/21 220 lb (99.8 kg)      Other studies Reviewed: Additional studies/ records that were reviewed today include: Hospital records. Review of the above records demonstrates:  Please see elsewhere in the note.     ASSESSMENT AND  PLAN:  Atrial fib: He is still wearing his monitor.  I have not received any alerts from this.  I think since he had unprovoked atrial fibrillation and he is CHA2DS2-VASc he is 2 he should remain on the Eliquis.  I do not see that he has ever had a TSH and I will have this drawn.  Sleep apnea:    He had a previous diagnosis of sleep apnea but this was years ago and he has not been using CPAP.  He says he is lost weight.  I would like to screen him again with a home sleep study.  HTN: His blood pressure was in the 150 systolic in his left arm and 130s in his right.  I told him to start taking his blood pressure in his left arm and he might need an increased dose of lisinopril.  Current medicines are reviewed at length with the patient today.  The patient does not have concerns regarding medicines.  The following changes have been made:  no change  Labs/ tests ordered today include:   Orders Placed This Encounter  Procedures   TSH   [place] Sleep Study     Disposition:   FU with me in 6 months   Signed, Rollene Rotunda, MD  06/21/2022 4:07 PM    [facility]

## 2022-06-21 ENCOUNTER — Ambulatory Visit: Payer: Medicare HMO | Admitting: Cardiology

## 2022-06-21 ENCOUNTER — Encounter: Payer: Self-pay | Admitting: Cardiology

## 2022-06-21 VITALS — BP 138/60 | HR 77 | Ht 71.0 in | Wt 232.2 lb

## 2022-06-21 DIAGNOSIS — I4891 Unspecified atrial fibrillation: Secondary | ICD-10-CM

## 2022-06-21 DIAGNOSIS — G4730 Sleep apnea, unspecified: Secondary | ICD-10-CM

## 2022-06-21 NOTE — Patient Instructions (Addendum)
Medication Instructions:  None *If you need a refill on your cardiac medications before your next appointment, please call your pharmacy*   Lab Work: TSH If you have labs (blood work) drawn today and your tests are completely normal, you will receive your results only by: MyChart Message (if you have MyChart) OR A paper copy in the mail If you have any lab test that is abnormal or we need to change your treatment, we will call you to review the results.   Testing/Procedures:  WatchPAT?  Is a FDA cleared portable home sleep study test that uses a watch and 3 points of contact to monitor 7 different channels, including your heart rate, oxygen saturations, body position, snoring, and chest motion.  The study is easy to use from the comfort of your own home and accurately detect sleep apnea.  Before bed, you attach the chest sensor, attached the sleep apnea bracelet to your nondominant hand, and attach the finger probe.  After the study, the raw data is downloaded from the watch and scored for apnea events.   For more information: [email or web address]  Patient Testing Instructions:  Do not put battery into the device until bedtime when you are ready to begin the test. Please call the support number if you need assistance after following the instructions below: 24 hour support line- [phone] or [facility]AM[facility] support at [phone] (option 2)  Download the IntelWatchPAT One" app through the google play store or App Store  Be sure to turn on or enable access to bluetooth in settlings on your smartphone/ device  Make sure no other bluetooth devices are on and within the vicinity of your smartphone/ device and WatchPAT watch during testing.  Make sure to leave your smart phone/ device plugged in and charging all night.  When ready for bed:  Follow the instructions step by step in the WatchPAT One App to activate the testing device. For additional instructions, including video  instruction, visit the WatchPAT One video on Youtube. You can search for WatchPat One within Youtube (video is 4 minutes and 18 seconds) or enter: https://youtube/watch?v=BCce_vbiwxE Please note: You will be prompted to enter a Pin to connect via bluetooth when starting the test. The PIN will be assigned to you when you receive the test.  The device is disposable, but it recommended that you retain the device until you receive a call letting you know the study has been received and the results have been interpreted.  We will let you know if the study did not transmit to [place] properly after the test is completed. You do not need to call us to confirm the receipt of the test.  Please complete the test within 48 hours of receiving PIN.   Frequently Asked Questions:  What is Watch Dennie Bible one?  A single use fully disposable home sleep apnea testing device and will not need to be returned after completion.  What are the requirements to use WatchPAT one?  The be able to have a successful watchpat one sleep study, you should have your Watch pat one device, your smart phone, watch pat one app, your PIN number and Internet access What type of phone do I need?  You should have a smart phone that uses Android 5.1 and above or any Iphone with IOS 10 and above How can I download the WatchPAT one app?  Based on your device type search for WatchPAT one app either in google play for android devices or APP store for Iphone's Where  will I get my PIN for the study?  Your PIN will be provided by your physician's office. It is used for authentication and if you lose/forget your PIN, please reach out to your providers office.  I do not have Internet at home. Can I do WatchPAT one study?  WatchPAT One needs Internet connection throughout the night to be able to transmit the sleep data. You can use your home/local internet or your cellular's data package. However, it is always recommended to use home/local Internet. It is  estimated that between 20MB-30MB will be used with each study.However, the application will be looking for space in the phone to start the study.  What happens if I lose internet or bluetooth connection?  During the internet disconnection, your phone will not be able to transmit the sleep data. All the data, will be stored in your phone. As soon as the internet connection is back on, the phone will being sending the sleep data. During the bluetooth disconnection, WatchPAT one will not be able to to send the sleep data to your phone. Data will be kept in the [facility] one until two devices have bluetooth connection back on. As soon as the connection is back on, WatchPAT one will send the sleep data to the phone.  How long do I need to wear the WatchPAT one?  After you start the study, you should wear the device at least 6 hours.  How far should I keep my phone from the device?  During the night, your phone should be within 15 feet.  What happens if I leave the room for restroom or other reasons?  Leaving the room for any reason will not cause any problem. As soon as your get back to the room, both devices will reconnect and will continue to send the sleep data. Can I use my phone during the sleep study?  Yes, you can use your phone as usual during the study. But it is recommended to put your watchpat one on when you are ready to go to bed.  How will I get my study results?  A soon as you completed your study, your sleep data will be sent to the provider. They will then share the results with you when they are ready.     Your physician wants you to follow-up in: 6 MONTHS WITH DR Antoine Poche You will receive a reminder letter in the mail two months in advance. If you don't receive a letter, please call our office to schedule the follow-up appointment.    Follow-Up: At [facility], you and your health needs are our priority.  As part of our continuing mission to provide you with exceptional heart  care, we have created designated Provider Care Teams.  These Care Teams include your primary Cardiologist (physician) and Advanced Practice Providers (APPs -  Physician Assistants and Nurse Practitioners) who all work together to provide you with the care you need, when you need it.  We recommend signing up for the patient portal called "MyChart".  Sign up information is provided on this After Visit Summary.  MyChart is used to connect with patients for Virtual Visits (Telemedicine).  Patients are able to view lab/test results, encounter notes, upcoming appointments, etc.  Non-urgent messages can be sent to your provider as well.   To learn more about what you can do with MyChart, go to [email or web address].    Your next appointment:   6 month(s)  The format for your next appointment:  In Person  Provider:       Important Information About Sugar

## 2022-06-22 LAB — TSH: TSH: 2.53 u[IU]/mL (ref 0.450–4.500)

## 2022-07-02 ENCOUNTER — Encounter: Payer: Self-pay | Admitting: Cardiology

## 2022-07-09 ENCOUNTER — Telehealth: Payer: Self-pay | Admitting: Cardiology

## 2022-07-09 ENCOUNTER — Telehealth: Payer: Self-pay

## 2022-07-09 NOTE — Telephone Encounter (Signed)
Patient with diagnosis of A Fib on Eliquis for anticoagulation.    Procedure: epidural steroid inj Date of procedure: TBD   CHA2DS2-VASc Score = 3  This indicates a 3.2% annual risk of stroke. The patient's score is based upon: CHF History: 0 HTN History: 1 Diabetes History: 0 Stroke History: 0 Vascular Disease History: 0 Age Score: 2 Gender Score: 0    CrCl 68 mL/min using adjusted body weight Platelet count 333K  Per office protocol, patient can hold Eliquis for 3 days prior to procedure.    **This guidance is not considered finalized until pre-operative APP has relayed final recommendations.**

## 2022-07-09 NOTE — Telephone Encounter (Signed)
Clearance has been given with recommendations to Eliquis. See notes from Robin Searing, NP.

## 2022-07-09 NOTE — Telephone Encounter (Signed)
SEE CLEARANCE NOTES

## 2022-07-09 NOTE — Telephone Encounter (Signed)
   Patient Name: Vincent Rivas  DOB: [birth year] MRN: [id number]  Primary Cardiologist: None  Chart reviewed as part of pre-operative protocol coverage.   Patient with diagnosis of A Fib on Eliquis for anticoagulation.    CrCl 68 mL/min using adjusted body weight Platelet count 333K   Per office protocol, patient can hold Eliquis for 3 days prior to procedure.   Procedure: epidural steroid inj Date of procedure: TBD   Napoleon Form, Leodis Rains, NP 07/09/2022, 11:20 AM

## 2022-07-09 NOTE — Telephone Encounter (Signed)
Calling to F/U on Clearance that was sent via on 7/24 and 7/28. Pt is scheduled for procedure on this Wednesday 8/2 and needs Eliquis to be held. Please advise

## 2022-07-09 NOTE — Telephone Encounter (Signed)
   Pre-operative Risk Assessment    Patient Name: Vincent Rivas  DOB: 04/09/1941 MRN: [id number]      Request for Surgical Clearance    Procedure:   Epidural steroid injection   Date of Surgery:  Clearance TBD                                 Surgeon:  Dr. Peggye Pitt  Surgeon's Group or Practice Name:  [facility]  Phone number:  [phone] Fax number:  [phone]   Type of Clearance Requested:   - Pharmacy:  Hold Apixaban (Eliquis) 3 days prior    Type of Anesthesia:  Local    Additional requests/questions:    Deforest Hoyles   07/09/2022, 10:00 AM

## 2022-07-10 ENCOUNTER — Telehealth: Payer: Self-pay | Admitting: *Deleted

## 2022-07-10 ENCOUNTER — Telehealth: Payer: Self-pay

## 2022-07-10 NOTE — Telephone Encounter (Signed)
Called and made the patient aware that He may proceed with the Itamar Home Sleep Study. PIN # provided to the patient. Patient made aware that He will be contacted after the test has been read with the results and any recommendations. Patient verbalized understanding and thanked me for the call.   

## 2022-07-10 NOTE — Telephone Encounter (Signed)
Staff message sent to Vincent Rivas ok to activate Itamar.

## 2022-07-11 ENCOUNTER — Encounter (INDEPENDENT_AMBULATORY_CARE_PROVIDER_SITE_OTHER): Payer: Medicare HMO | Admitting: Cardiology

## 2022-07-11 DIAGNOSIS — G4733 Obstructive sleep apnea (adult) (pediatric): Secondary | ICD-10-CM

## 2022-07-14 NOTE — Procedures (Signed)
   SLEEP STUDY REPORT Patient Information Study Date: 07/11/22 Patient Name: Vincent Rivas Patient ID: [id number] Birth Date: 2041/01/27 Age: 81 Gender: Male BMI: 32.4 (W=231 lb, H=5' 11'') Referring Physician: Rollene Rotunda, MD  TEST DESCRIPTION: Home sleep apnea testing was completed using the WatchPat, a Type 1 device, utilizing peripheral arterial tonometry (PAT), chest movement, actigraphy, pulse oximetry, pulse rate, body position and snore. AHI was calculated with apnea and hypopnea using valid sleep time as the denominator. RDI includes apneas, hypopneas, and RERAs. The data acquired and the scoring of sleep and all associated events were performed in accordance with the recommended standards and specifications as outlined in the AASM Manual for the Scoring of Sleep and Associated Events 2.2.0 (2015).  FINDINGS: 1. Mild Obstructive Sleep Apnea with AHI 9.3/hr. 2. No Central Sleep Apnea with pAHIc 0.4/hr. 3. Oxygen desaturations as low as 86%. 4. Mild snoring was present. O2 sats were < 88% for 0.1 min. 5. Total sleep time was 8 hrs and 38 min. 6. 31% of total sleep time was spent in REM sleep. 7. Normal sleep onset latency at 25 min. 8. Prolonged REM sleep onset latency at 162 min. 9. Total awakenings were 4.  DIAGNOSIS: Mild Obstructive Sleep Apnea (G47.33)  RECOMMENDATIONS: 1. Clinical correlation of these findings is necessary. The decision to treat obstructive sleep apnea (OSA) is usually based on the presence of apnea symptoms or the presence of associated medical conditions such as Hypertension, Congestive Heart Failure, Atrial Fibrillation or Obesity. The most common symptoms of OSA are snoring, gasping for breath while sleeping, daytime sleepiness and fatigue.  2. Initiating apnea therapy is recommended given the presence of symptoms and/or associated conditions. Recommend proceeding with one of the following:   a. Auto-CPAP therapy with a pressure range of  5-20cm H2O.   b. An oral appliance (OA) that can be obtained from certain dentists with expertise in sleep medicine. These are primarily of use in non-obese patients with mild and moderate disease.   c. An ENT consultation which may be useful to look for specific causes of obstruction and possible treatment options.   d. If patient is intolerant to PAP therapy, consider referral to ENT for evaluation for hypoglossal nerve stimulator.  3. Close follow-up is necessary to ensure success with CPAP or oral appliance therapy for maximum benefit .  4. A follow-up oximetry study on CPAP is recommended to assess the adequacy of therapy and determine the need for supplemental oxygen or the potential need for Bi-level therapy. An arterial blood gas to determine the adequacy of baseline ventilation and oxygenation should also be considered.  5. Healthy sleep recommendations include: adequate nightly sleep (normal 7-9 hrs/night), avoidance of caffeine after noon and alcohol near bedtime, and maintaining a sleep environment that is cool, dark and quiet.  6. Weight loss for overweight patients is recommended. Even modest amounts of weight loss can significantly improve the severity of sleep apnea.  7. Snoring recommendations include: weight loss where appropriate, side sleeping, and avoidance of alcohol before bed.  8. Operation of motor vehicle should be avoided when sleepy.  Signature: Armanda Magic, MD; [facility]; Diplomat, American Board of Sleep Medicine Electronically Signed: 07/15/22

## 2022-07-15 ENCOUNTER — Ambulatory Visit: Payer: Medicare HMO

## 2022-07-15 DIAGNOSIS — I4891 Unspecified atrial fibrillation: Secondary | ICD-10-CM

## 2022-07-15 DIAGNOSIS — G4730 Sleep apnea, unspecified: Secondary | ICD-10-CM

## 2022-07-24 ENCOUNTER — Telehealth: Payer: Self-pay | Admitting: *Deleted

## 2022-07-24 ENCOUNTER — Encounter (HOSPITAL_COMMUNITY): Payer: Self-pay | Admitting: Physician Assistant

## 2022-07-24 ENCOUNTER — Ambulatory Visit (HOSPITAL_COMMUNITY)
Admit: 2022-07-24 | Discharge: 2022-07-24 | Disposition: A | Discharge status: Home / Self Care | Payer: Medicare HMO | Attending: Physician Assistant | Admitting: Physician Assistant

## 2022-07-24 ENCOUNTER — Other Ambulatory Visit: Payer: Self-pay | Admitting: Cardiology

## 2022-07-24 VITALS — BP 132/62 | HR 64 | Ht 71.0 in | Wt 232.8 lb

## 2022-07-24 DIAGNOSIS — D6869 Other thrombophilia: Secondary | ICD-10-CM | POA: Insufficient documentation

## 2022-07-24 DIAGNOSIS — Z7901 Long term (current) use of anticoagulants: Secondary | ICD-10-CM | POA: Insufficient documentation

## 2022-07-24 DIAGNOSIS — Z6832 Body mass index (BMI) 32.0-32.9, adult: Secondary | ICD-10-CM | POA: Insufficient documentation

## 2022-07-24 DIAGNOSIS — G4733 Obstructive sleep apnea (adult) (pediatric): Secondary | ICD-10-CM

## 2022-07-24 DIAGNOSIS — I10 Essential (primary) hypertension: Secondary | ICD-10-CM | POA: Insufficient documentation

## 2022-07-24 DIAGNOSIS — I480 Paroxysmal atrial fibrillation: Secondary | ICD-10-CM

## 2022-07-24 DIAGNOSIS — E669 Obesity, unspecified: Secondary | ICD-10-CM | POA: Diagnosis not present

## 2022-07-24 NOTE — Telephone Encounter (Signed)
Patient notified of sleep study results and recommendations. He agrees to start CPAP therapy. Orders sent to [employer] via Epic. Elige Radon New notified via community message.

## 2022-07-24 NOTE — Progress Notes (Signed)
Primary Care Physician: Arman Bogus., MD Primary Cardiologist: Dr Percival Spanish Primary Electrophysiologist: none Referring Physician: Dr Danton Clap is a 81 y.o. male with a history of HTN, OSA, atrial fibrillation who presents for consultation in the [facility].  The patient was initially diagnosed with atrial fibrillation 05/31/22 after presenting to the ED with symptoms of fatigue, SOB, and an irregular pulse. ECG showed afib with RVR. He was started on IV diltiazem which did convert him to SR. Patient was started on Eliquis for a CHADS2VASC score of 3 and oral diltiazem. Echo showed normal EF, no valvular issues. An event monitor was ordered at discharge which showed no afib.  Today, patient reports that he feels well. He did have a sleep study 07/11/22 which showed mild OSA. He denies any bleeding issues on anticoagulation.   Today, he denies symptoms of palpitations, chest pain, shortness of breath, orthopnea, PND, lower extremity edema, dizziness, presyncope, syncope, bleeding, or neurologic sequela. The patient is tolerating medications without difficulties and is otherwise without complaint today.    Atrial Fibrillation Risk Factors:  he does have symptoms or diagnosis of sleep apnea. he does not have a history of rheumatic fever. he does have a history of alcohol use. The patient does not have a history of early familial atrial fibrillation or other arrhythmias.  he has a BMI of Body mass index is 32.47 kg/m.Marland Kitchen Filed Weights   07/24/22 0900  Weight: 105.6 kg    Family History  Problem Relation Age of Onset   Diabetes Mother    CAD Mother 83   Kidney failure Father        Complications of a severe burn     Atrial Fibrillation Management history:  Previous antiarrhythmic drugs: none Previous cardioversions: none Previous ablations: none CHADS2VASC score: 3 Anticoagulation history: Eliquis   Past Medical History:   Diagnosis Date   Arthritis    Chronic prostatitis    also with BPH   GERD (gastroesophageal reflux disease)    Hypertension    Sleep apnea    does not wear CPAP   Past Surgical History:  Procedure Laterality Date   CERVICAL SPINE SURGERY     2006   JOINT REPLACEMENT     2007 RT HIP REPLACEMENT,  2012LTHR    JOINT REPLACEMENT Left    HIP   LUMBAR SPINE SURGERY     ROTATOR CUFF REPAIR Bilateral 04/05/2022   also in 2020    Current Outpatient Medications  Medication Sig Dispense Refill   acetaminophen (TYLENOL) 650 MG CR tablet Take 1,300 mg by mouth 2 (two) times daily as needed (neck/shoulder pain).     apixaban (ELIQUIS) 5 MG TABS tablet Take 1 tablet (5 mg total) by mouth 2 (two) times daily. 60 tablet 1   diltiazem (CARDIZEM CD) 120 MG 24 hr capsule Take 1 capsule (120 mg total) by mouth daily. 30 capsule 1   DULoxetine (CYMBALTA) 60 MG capsule Take 60 mg by mouth daily.     hydrochlorothiazide (HYDRODIURIL) 25 MG tablet Take by mouth.     lisinopril (ZESTRIL) 40 MG tablet Take 40 mg by mouth daily.     metoprolol tartrate (LOPRESSOR) 25 MG tablet Take by mouth in the morning and at bedtime.     omeprazole (PRILOSEC) 40 MG capsule Take 40 mg by mouth daily.     No current facility-administered medications for this encounter.    Allergies  Allergen Reactions  Pollen Extract Cough and Itching   Neurontin [Gabapentin] Other (See Comments)    Extreme fatigue     Social History   Socioeconomic History   Marital status: Married    Spouse name: Not on file   Number of children: Not on file   Years of education: Not on file   Highest education level: Not on file  Occupational History   Not on file  Tobacco Use   Smoking status: Former    Packs/day: 1.50    Years: 10.00    Total pack years: 15.00    Types: Cigarettes   Smokeless tobacco: Never   Tobacco comments:    Former smoker 07/24/22  Vaping Use   Vaping Use: Never used  Substance and Sexual Activity    Alcohol use: Yes    Alcohol/week: 7.0 standard drinks of alcohol    Types: 7 Glasses of wine per week    Comment: WINE  4 OZ + tonic water 07/24/22   Drug use: No   Sexual activity: Not Currently  Other Topics Concern   Not on file  Social History Narrative   Smoked in his twenties.  Retired Engineer, maintenance for [employer].  Two children.  Lives with wife.     Social Determinants of Health   Financial Resource Strain: Not on file  Food Insecurity: Not on file  Transportation Needs: Not on file  Physical Activity: Not on file  Stress: Not on file  Social Connections: Not on file  Intimate Partner Violence: Not on file     ROS- All systems are reviewed and negative except as per the HPI above.  Physical Exam: Vitals:   07/24/22 0900  BP: 132/62  Pulse: 64  Weight: 105.6 kg  Height: 5\' 11"  (1.803 m)    GEN- The patient is a well appearing elderly obese male, alert and oriented x 3 today.   Head- normocephalic, atraumatic Eyes-  Sclera clear, conjunctiva pink Ears- hearing intact Oropharynx- clear Neck- supple  Lungs- Clear to ausculation bilaterally, normal work of breathing Heart- Regular rate and rhythm, no murmurs, rubs or gallops  GI- soft, NT, ND, + BS Extremities- no clubbing, cyanosis, or edema MS- no significant deformity or atrophy Skin- no rash or lesion Psych- euthymic mood, full affect Neuro- strength and sensation are intact  Wt Readings from Last 3 Encounters:  07/24/22 105.6 kg  06/21/22 105.3 kg  05/31/22 103.4 kg    EKG today demonstrates  SR Vent. rate 64 BPM PR interval 182 ms QRS duration 104 ms QT/QTcB 416/429 ms  Echo 05/31/22 demonstrated   1. Left ventricular ejection fraction, by estimation, is 55 to 60%. The  left ventricle has normal function. The left ventricle has no regional  wall motion abnormalities. The left ventricular internal cavity size was mildly dilated. Left ventricular diastolic parameters are consistent with Grade I  diastolic dysfunction (impaired relaxation).   2. Right ventricular systolic function is normal. The right ventricular  size is normal.   3. The mitral valve is normal in structure. Trivial mitral valve  regurgitation. No evidence of mitral stenosis.   4. The aortic valve is tricuspid. Aortic valve regurgitation is mild.  Aortic valve sclerosis/calcification is present, without any evidence of  aortic stenosis.   5. The inferior vena cava is normal in size with greater than 50%  respiratory variability, suggesting right atrial pressure of 3 mmHg.   Epic records are reviewed at length today  CHA2DS2-VASc Score = 3  The patient's score is  based upon: CHF History: 0 HTN History: 1 Diabetes History: 0 Stroke History: 0 Vascular Disease History: 0 Age Score: 2 Gender Score: 0       ASSESSMENT AND PLAN: 1. Paroxysmal Atrial Fibrillation (ICD10:  I48.0) The patient's CHA2DS2-VASc score is 3, indicating a 3.2% annual risk of stroke.   Event monitor showed no afib. Continue Eliquis 5 mg BID Continue diltiazem 120 mg daily Continue Lopressor 25 mg BID  2. Secondary Hypercoagulable State (ICD10:  D68.69) The patient is at significant risk for stroke/thromboembolism based upon his CHA2DS2-VASc Score of 3.  Continue Apixaban (Eliquis).   3. Obesity Body mass index is 32.47 kg/m. Lifestyle modification was discussed at length including regular exercise and weight reduction.  4. Obstructive sleep apnea Sleep study completed 07/11/22. The importance of adequate treatment of sleep apnea was discussed today in order to improve our ability to maintain sinus rhythm long term.  5. HTN Stable, no changes today.   Follow up with Dr Antoine Poche per recall. AF clinic in one year.    Jorja Loa PA-C [facility] [address] [place], [place] [address] [phone] 07/24/2022 3:55 PM

## 2022-07-24 NOTE — Telephone Encounter (Signed)
-----   Message from Lattie Haw, RN sent at 07/21/2022  4:06 PM EDT ----- Donnie Coffin ordered by Dr. Antoine Poche ----- Message ----- From: Quintella Reichert, MD Sent: 07/14/2022   8:55 PM EDT To: Cv [facility] Sleep Studies  Please let patient know that they have sleep apnea and recommend treating with CPAP.  Please order an auto CPAP from 4-15cm H2O with heated humidity and mask of choice.  Order overnight pulse ox on CPAP.  Followup with me in 6 weeks.

## 2022-08-06 ENCOUNTER — Telehealth: Payer: Self-pay | Admitting: Cardiology

## 2022-08-06 NOTE — Telephone Encounter (Signed)
*  STAT* If patient is at the pharmacy, call can be transferred to refill team.   1. Which medications need to be refilled? (please list name of each medication and dose if known) apixaban (ELIQUIS) 5 MG TABS tablet diltiazem (CARDIZEM CD) 120 MG 24 hr capsule  2. Which pharmacy/location (including street and city if local pharmacy) is medication to be sent to? [id number] [place], [place] - [address]  3. Do they need a 30 day or 90 day supply? 90 day    Patient is out of the Eliquis

## 2022-08-07 MED ORDER — APIXABAN 5 MG PO TABS: 5.0000 mg | ORAL_TABLET | Freq: Two times a day (BID) | ORAL | 1 refills | Status: DC

## 2022-08-07 NOTE — Telephone Encounter (Signed)
Prescription refill request for Eliquis received. Indication:Afib Last office visit:7/23 Scr:1.0 Age: 81 Weight:105.6 kg  Prescription refilled

## 2022-08-08 ENCOUNTER — Encounter: Payer: Self-pay | Admitting: Cardiology

## 2022-10-23 ENCOUNTER — Ambulatory Visit: Payer: Medicare HMO | Attending: Cardiology | Admitting: Cardiology

## 2022-10-23 ENCOUNTER — Telehealth: Payer: Self-pay | Admitting: *Deleted

## 2022-10-23 ENCOUNTER — Encounter: Payer: Self-pay | Admitting: Cardiology

## 2022-10-23 VITALS — BP 130/54 | HR 73 | Ht 71.0 in | Wt 233.6 lb

## 2022-10-23 DIAGNOSIS — G4733 Obstructive sleep apnea (adult) (pediatric): Secondary | ICD-10-CM

## 2022-10-23 DIAGNOSIS — I10 Essential (primary) hypertension: Secondary | ICD-10-CM

## 2022-10-23 NOTE — Patient Instructions (Signed)
Medication Instructions:  Your physician recommends that you continue on your current medications as directed. Please refer to the Current Medication list given to you today.  *If you need a refill on your cardiac medications before your next appointment, please call your pharmacy*   Follow-Up: At [facility], you and your health needs are our priority.  As part of our continuing mission to provide you with exceptional heart care, we have created designated Provider Care Teams.  These Care Teams include your primary Cardiologist (physician) and Advanced Practice Providers (APPs -  Physician Assistants and Nurse Practitioners) who all work together to provide you with the care you need, when you need it.   Your next appointment:   1 year(s)  The format for your next appointment:   In Person  Provider:   Dr. Turner   Important Information About Sugar       

## 2022-10-23 NOTE — Telephone Encounter (Signed)
-----   Message from Eilleen Kempf, RN sent at 10/23/2022 10:13 AM EST ----- Regarding: chin strap Dr. Mayford Knife wants you to order a chin strap and download in 4 weeks.  Thanks MA

## 2022-10-23 NOTE — Telephone Encounter (Signed)
Per Dr Mayford Knife,  Large nasal pillow mask  -I will add a chin strap and get a download in 4 weeks

## 2022-10-23 NOTE — Progress Notes (Signed)
Sleep Medicine CONSULT Note    Date:  10/23/2022   ID:  Vincent Rivas, DOB 24-Dec-1940, MRN [id number]  PCP:  Roberts Gaudy., MD  Cardiologist:  Armanda Magic, MD   Chief Complaint  Patient presents with   New Patient (Initial Visit)    Obstructive sleep apnea    History of Present Illness:  Vincent Rivas is a 81 y.o. male who is being seen today for the evaluation of obstructive sleep apnea at the request of Vincent Rotunda, MD.  This is an 81 year old male with a history of GERD, hypertension and prior history of obstructive sleep apnea who was not wearing his CPAP.  He used the CPAP years ago but lost 35lbs and did not think he needed it anymore and stopped using it.  He also said that he had a hard time falling asleep in the past with the CPAP and had to take a sleep aide.  He says that he was having problems with excessive daytime sleepiness recently and would wake up feeling tired and have to nap during the day. Stop Bang score is 5.   He saw Dr. Antoine Poche in July 2023 and since he was not wearing CPAP, has had problems with A-fib and had not seen a sleep medicine doctor he ordered a home sleep study which showed mild obstructive sleep apnea with an AHI of 9.3/h and no central events.  There was no nocturnal hypoxemia.  He was started on auto CPAP from 4 to 15 cm H2O and is now referred for sleep evaluation  He is doing well with his PAP device and thinks that he has gotten used to it.  He tolerates the nasal pillow mask but does not use a chin strap and has been having problems with his mouth falling open while sleeping and then gets a dry mouth. He feels the pressure is adequate.  Since going on PAP he feels rested in the am and has no significant daytime sleepiness.  He denies any significant nasal dryness or nasal congestion.  He does not think that he snores.    Past Medical History:  Diagnosis Date   Arthritis    Chronic prostatitis    also with BPH   GERD  (gastroesophageal reflux disease)    Hypertension    Sleep apnea    does not wear CPAP    Past Surgical History:  Procedure Laterality Date   CERVICAL SPINE SURGERY     2006   JOINT REPLACEMENT     2007 RT HIP REPLACEMENT,  [id number]    JOINT REPLACEMENT Left    HIP   LUMBAR SPINE SURGERY     ROTATOR CUFF REPAIR Bilateral 04/05/2022   also in 2020    Current Medications: Current Meds  Medication Sig   acetaminophen (TYLENOL) 650 MG CR tablet Take 1,300 mg by mouth 2 (two) times daily as needed (neck/shoulder pain).   apixaban (ELIQUIS) 5 MG TABS tablet Take 1 tablet (5 mg total) by mouth 2 (two) times daily.   diltiazem (CARDIZEM CD) 120 MG 24 hr capsule Take 1 capsule (120 mg total) by mouth daily.   DULoxetine (CYMBALTA) 60 MG capsule Take 60 mg by mouth daily.   hydrochlorothiazide (HYDRODIURIL) 25 MG tablet Take by mouth.   lisinopril (ZESTRIL) 40 MG tablet Take 40 mg by mouth daily.   metoprolol tartrate (LOPRESSOR) 25 MG tablet Take by mouth in the morning and at bedtime.   omeprazole (PRILOSEC) 40 MG  capsule Take 40 mg by mouth daily.    Allergies:   Pollen extract and Neurontin [gabapentin]   Social History   Socioeconomic History   Marital status: Married    Spouse name: Not on file   Number of children: Not on file   Years of education: Not on file   Highest education level: Not on file  Occupational History   Not on file  Tobacco Use   Smoking status: Former    Packs/day: 1.50    Years: 10.00    Total pack years: 15.00    Types: Cigarettes   Smokeless tobacco: Never   Tobacco comments:    Former smoker 07/24/22  Vaping Use   Vaping Use: Never used  Substance and Sexual Activity   Alcohol use: Yes    Alcohol/week: 7.0 standard drinks of alcohol    Types: 7 Glasses of wine per week    Comment: WINE  4 OZ + tonic water 07/24/22   Drug use: No   Sexual activity: Not Currently  Other Topics Concern   Not on file  Social History Narrative   Smoked  in his twenties.  Retired Engineer, maintenance for [employer].  Two children.  Lives with wife.     Social Determinants of Health   Financial Resource Strain: Not on file  Food Insecurity: Not on file  Transportation Needs: Not on file  Physical Activity: Not on file  Stress: Not on file  Social Connections: Not on file     Family History:  The patient's family history includes CAD (age of onset: 75) in his mother; Diabetes in his mother; Kidney failure in his father.   ROS:   Please see the history of present illness.    ROS All other systems reviewed and are negative.      No data to display             PHYSICAL EXAM:   VS:  BP (!) 130/54   Pulse 73   Ht 5\' 11"  (1.803 m)   Wt 233 lb 9.6 oz (106 kg)   SpO2 97%   BMI 32.58 kg/m    GEN: Well nourished, well developed, in no acute distress  HEENT: normal  Neck: no JVD, carotid bruits, or masses Cardiac: RRR; no murmurs, rubs, or gallops,no edema.  Intact distal pulses bilaterally.  Respiratory:  clear to auscultation bilaterally, normal work of breathing GI: soft, nontender, nondistended, + BS MS: no deformity or atrophy  Skin: warm and dry, no rash Neuro:  Alert and Oriented x 3, Strength and sensation are intact Psych: euthymic mood, full affect  Wt Readings from Last 3 Encounters:  10/23/22 233 lb 9.6 oz (106 kg)  07/24/22 232 lb 12.8 oz (105.6 kg)  06/21/22 232 lb 3.2 oz (105.3 kg)      Studies/Labs Reviewed:   Home sleep study and PAP compliance download  Recent Labs: 05/31/2022: BUN 23; Creatinine, Ser 1.03; Hemoglobin 14.2; Platelets 333; Potassium 4.5; Sodium 134 06/21/2022: TSH 2.530    CHA2DS2-VASc Score = 3   This indicates a 3.2% annual risk of stroke. The patient's score is based upon: CHF History: 0 HTN History: 1 Diabetes History: 0 Stroke History: 0 Vascular Disease History: 0 Age Score: 2 Gender Score: 0           Additional studies/ records that were reviewed today include:  Office visit  notes by Dr. 06/23/2022    ASSESSMENT:    1. Obstructive sleep apnea (adult) (pediatric)  2. Essential hypertension      PLAN:  In order of problems listed above:  OSA - The patient is tolerating PAP therapy well without any problems. The PAP download performed by his DME was personally reviewed and interpreted by me today and showed an AHI of 7.4 /hr on auto CPAP from 4-15 cm H2O with 100% compliance in using more than 4 hours nightly.  The patient has been using and benefiting from PAP use and will continue to benefit from therapy.  -his AHI is elevated still but I suspect this is related to mouth breathing while using the nasal pillow mask -I will add a chin strap and get a download in 4 weeks  2.  Hypertension -BP controlled on exam -Continue prescription drug management with Cardizem CD 120 mg daily, HCTZ 25 mg daily, lisinopril 40 mg daily and Lopressor 25 mg twice daily with as needed refills   Time Spent: 20 minutes total time of encounter, including 15 minutes spent in face-to-face patient care on the date of this encounter. This time includes coordination of care and counseling regarding above mentioned problem list. Remainder of non-face-to-face time involved reviewing chart documents/testing relevant to the patient encounter and documentation in the medical record. I have independently reviewed documentation from referring provider  Medication Adjustments/Labs and Tests Ordered: Current medicines are reviewed at length with the patient today.  Concerns regarding medicines are outlined above.  Medication changes, Labs and Tests ordered today are listed in the Patient Instructions below.  There are no Patient Instructions on file for this visit.   Signed, Armanda Magic, MD  10/23/2022 10:08 AM    [facility] [address] [place], [place], [place]  [address] Phone: [phone]; Fax: [phone]

## 2022-11-12 ENCOUNTER — Encounter: Payer: Self-pay | Admitting: Cardiology

## 2022-11-13 ENCOUNTER — Encounter (HOSPITAL_BASED_OUTPATIENT_CLINIC_OR_DEPARTMENT_OTHER): Payer: Self-pay

## 2022-11-13 ENCOUNTER — Emergency Department (HOSPITAL_BASED_OUTPATIENT_CLINIC_OR_DEPARTMENT_OTHER): Payer: Medicare HMO

## 2022-11-13 ENCOUNTER — Other Ambulatory Visit: Payer: Self-pay

## 2022-11-13 ENCOUNTER — Emergency Department (HOSPITAL_BASED_OUTPATIENT_CLINIC_OR_DEPARTMENT_OTHER)
Admission: EM | Admit: 2022-11-13 | Discharge: 2022-11-13 | Disposition: A | Discharge status: Home / Self Care | Payer: Medicare HMO | Attending: Emergency Medicine | Admitting: Emergency Medicine

## 2022-11-13 DIAGNOSIS — Z7901 Long term (current) use of anticoagulants: Secondary | ICD-10-CM | POA: Insufficient documentation

## 2022-11-13 DIAGNOSIS — U071 COVID-19: Secondary | ICD-10-CM | POA: Insufficient documentation

## 2022-11-13 DIAGNOSIS — Y9301 Activity, walking, marching and hiking: Secondary | ICD-10-CM | POA: Insufficient documentation

## 2022-11-13 DIAGNOSIS — W1830XA Fall on same level, unspecified, initial encounter: Secondary | ICD-10-CM | POA: Diagnosis not present

## 2022-11-13 DIAGNOSIS — R5383 Other fatigue: Secondary | ICD-10-CM | POA: Diagnosis present

## 2022-11-13 DIAGNOSIS — S5001XA Contusion of right elbow, initial encounter: Secondary | ICD-10-CM | POA: Diagnosis not present

## 2022-11-13 LAB — CBC
HCT: 40.9 % (ref 39.0–52.0)
Hemoglobin: 13.8 g/dL (ref 13.0–17.0)
MCH: 27.8 pg (ref 26.0–34.0)
MCHC: 33.7 g/dL (ref 30.0–36.0)
MCV: 82.5 fL (ref 80.0–100.0)
Platelets: 276 10*3/uL (ref 150–400)
RBC: 4.96 MIL/uL (ref 4.22–5.81)
RDW: 13.9 % (ref 11.5–15.5)
WBC: 4.8 10*3/uL (ref 4.0–10.5)
nRBC: 0 % (ref 0.0–0.2)

## 2022-11-13 LAB — BASIC METABOLIC PANEL
Anion gap: 10 (ref 5–15)
BUN: 31 mg/dL — ABNORMAL HIGH (ref 8–23)
CO2: 21 mmol/L — ABNORMAL LOW (ref 22–32)
Calcium: 8.7 mg/dL — ABNORMAL LOW (ref 8.9–10.3)
Chloride: 105 mmol/L (ref 98–111)
Creatinine, Ser: 1.2 mg/dL (ref 0.61–1.24)
GFR, Estimated: >60 mL/min (ref >60)
Glucose, Bld: 134 mg/dL — ABNORMAL HIGH (ref 70–99)
Potassium: 3.7 mmol/L (ref 3.5–5.1)
Sodium: 136 mmol/L (ref 135–145)

## 2022-11-13 LAB — URINALYSIS, ROUTINE W REFLEX MICROSCOPIC
Bilirubin Urine: NEGATIVE
Glucose, UA: NEGATIVE mg/dL
Hgb urine dipstick: NEGATIVE
Ketones, ur: NEGATIVE mg/dL
Leukocytes,Ua: NEGATIVE
Nitrite: NEGATIVE
Protein, ur: NEGATIVE mg/dL
Specific Gravity, Urine: 1.025 (ref 1.005–1.030)
pH: 5 (ref 5.0–8.0)

## 2022-11-13 LAB — CBG MONITORING, ED: Glucose-Capillary: 135 mg/dL — ABNORMAL HIGH (ref 70–99)

## 2022-11-13 MED ORDER — MOLNUPIRAVIR EUA 200MG CAPSULE: 4.0000 | ORAL_CAPSULE | Freq: Two times a day (BID) | ORAL | 0 refills | Status: AC

## 2022-11-13 NOTE — ED Provider Notes (Signed)
[facility] EMERGENCY DEPARTMENT Provider Note   CSN: [id number] Arrival date & time: 11/13/22  1513     History  Chief Complaint  Patient presents with   Fatigue    Covid +    Vincent Rivas is a 81 y.o. male.  HPI 81 year old male with a history of afib, who is on metoprolol, Cardizem, and Eliquis presents from urgent care after a fall.  He has been feeling weak and not getting much sleep as he currently has COVID.  He was started on Paxlovid 2 days ago for this.  Talk to his cardiologist yesterday and they recommended he cut his Eliquis in half.  Today when coming back from the mailbox he felt weak in his legs and felt like his feet could not hold him up anymore and he fell to the ground.  He landed on his right elbow.  He has ecchymosis and swelling to the area but did not hit his head or lose consciousness.  He never felt unilateral weakness or numbness or have any facial/speech problems.  No headaches before or after.  No chest pain or shortness of breath.  He went to urgent care and they noted a concern for possible TIA and told him to come to the ER.  He also is concerned because he noticed his HR was around 52 after he fell and is concerned the Paxlovid is interacting with his Cardizem.  Home Medications Prior to Admission medications   Medication Sig Start Date End Date Taking? Authorizing Provider  molnupiravir EUA (LAGEVRIO) 200 mg CAPS capsule Take 4 capsules (800 mg total) by mouth 2 (two) times daily for 5 days. 11/13/22 11/18/22 Yes Sherwood Gambler, MD  acetaminophen (TYLENOL) 650 MG CR tablet Take 1,300 mg by mouth 2 (two) times daily as needed (neck/shoulder pain).    [provider]  apixaban (ELIQUIS) 5 MG TABS tablet Take 1 tablet (5 mg total) by mouth 2 (two) times daily. 08/07/22   Minus Breeding, MD  diltiazem (CARDIZEM CD) 120 MG 24 hr capsule Take 1 capsule (120 mg total) by mouth daily. 06/01/22   Karmen Bongo, MD  DULoxetine (CYMBALTA) 60  MG capsule Take 60 mg by mouth daily. 03/26/22   [provider]  hydrochlorothiazide (HYDRODIURIL) 25 MG tablet Take by mouth. 06/05/22   [provider]  lisinopril (ZESTRIL) 40 MG tablet Take 40 mg by mouth daily. 07/04/22   [provider]  metoprolol tartrate (LOPRESSOR) 25 MG tablet Take by mouth in the morning and at bedtime. 06/05/22   [provider]  omeprazole (PRILOSEC) 40 MG capsule Take 40 mg by mouth daily. 03/05/22   [provider]      Allergies    Pollen extract and Neurontin [gabapentin]    Review of Systems   Review of Systems  HENT:  Positive for sore throat.   Respiratory:  Positive for cough.   Musculoskeletal:  Positive for arthralgias.  Neurological:  Negative for numbness and headaches.    Physical Exam Updated Vital Signs BP (!) 149/49   Pulse (!) 55   Temp (!) 97.5 F (36.4 C)   Resp 16   Ht 5\' 11"  (1.803 m)   Wt 102.1 kg   SpO2 97%   BMI 31.38 kg/m  Physical Exam Vitals and nursing note reviewed.  Constitutional:      Appearance: He is well-developed.  HENT:     Head: Normocephalic and atraumatic.  Eyes:     Extraocular Movements: Extraocular  movements intact.     Pupils: Pupils are equal, round, and reactive to light.  Cardiovascular:     Rate and Rhythm: Normal rate and regular rhythm.     Pulses:          Radial pulses are 2+ on the right side.     Heart sounds: Normal heart sounds.     Comments: HR ~60 Pulmonary:     Effort: Pulmonary effort is normal.     Breath sounds: Normal breath sounds.  Abdominal:     Palpations: Abdomen is soft.     Tenderness: There is no abdominal tenderness.  Musculoskeletal:     Right elbow: Swelling (with ecchymosis) present. Normal range of motion. Tenderness present.  Skin:    General: Skin is warm and dry.  Neurological:     Mental Status: He is alert.     Comments: CN 3-12 grossly intact. 5/5 strength in all 4 extremities. Grossly normal sensation.  Normal finger to nose.      ED Results / Procedures / Treatments   Labs (all labs ordered are listed, but only abnormal results are displayed) Labs Reviewed  BASIC METABOLIC PANEL - Abnormal; Notable for the following components:      Result Value   CO2 21 (*)    Glucose, Bld 134 (*)    BUN 31 (*)    Calcium 8.7 (*)    All other components within normal limits  CBG MONITORING, ED - Abnormal; Notable for the following components:   Glucose-Capillary 135 (*)    All other components within normal limits  CBC  URINALYSIS, ROUTINE W REFLEX MICROSCOPIC    EKG EKG Interpretation  Date/Time:  Tuesday November 13 2022 15:48:29 EST Ventricular Rate:  65 PR Interval:  176 QRS Duration: 100 QT Interval:  430 QTC Calculation: 447 R Axis:   -1 Text Interpretation: Sinus rhythm with Premature atrial complexes Moderate voltage criteria for LVH, may be normal variant ( R in aVL , Cornell product ) similar to Aug 2023 Confirmed by Pricilla Loveless [phone]) on 11/13/2022 3:57:43 PM  Radiology DG Elbow Complete Right  Result Date: 11/13/2022 CLINICAL DATA:  Fall and trauma to the right elbow. EXAM: RIGHT ELBOW - COMPLETE 3+ VIEW COMPARISON:  None Available. FINDINGS: No acute fracture or dislocation. There is degenerative changes with spurring. Chronic fragment adjacent to the medial epicondyle. There is probable old healed fracture of the radial neck. No joint effusion. The soft tissues are grossly unremarkable. IMPRESSION: 1. No acute fracture or dislocation. 2. Degenerative changes. Electronically Signed   By: Elgie Collard M.D.   On: 11/13/2022 18:32    Procedures Procedures    Medications Ordered in ED Medications - No data to display  ED Course/ Medical Decision Making/ A&P                           Medical Decision Making Amount and/or Complexity of Data Reviewed External Data Reviewed: notes. Labs: ordered.    Details: Normal WBC, unremarkable electrolytes. Radiology:  ordered and independent interpretation performed.    Details: No fracture to the elbow. ECG/medicine tests: independent interpretation performed.    Details: No acute arrhythmia on EKG.   Patient presents after a fall while currently suffering from COVID.  There was no syncope or unilateral weakness to suggest strokelike symptoms or TIA.  While I understand he has stroke risk factors, it seems like his legs got weak bilaterally due to COVID, poor  sleep, etc.  He is on Paxlovid which can interact with both his Eliquis and his Cardizem and given he reported some lower heart rate in the low 50s, I think it would make sense to switch to molnupiravir and take away known drug interactions.  Otherwise, I do not think TIA/stroke work-up is needed.  He is otherwise feeling well.  His x-ray of his elbow was unremarkable and I think his bruising/swelling is from the Eliquis primarily.  Will recommend ice and Tylenol.  He otherwise has a benign neuro exam and appears stable for discharge home.        Final Clinical Impression(s) / ED Diagnoses Final diagnoses:  COVID-19  Contusion of right elbow, initial encounter    Rx / DC Orders ED Discharge Orders          Ordered    molnupiravir EUA (LAGEVRIO) 200 mg CAPS capsule  2 times daily        11/13/22 1915              Sherwood Gambler, MD 11/13/22 2350

## 2022-11-13 NOTE — ED Notes (Signed)
Ice applied to hematoma.

## 2022-11-13 NOTE — Discharge Instructions (Signed)
If you develop high fever, coughing up blood, trouble breathing, chest pain, weakness or numbness in your arms or legs, trouble speaking, or any other new/concerning symptoms then return to the ER for evaluation.  STOP the PAXLOVID.  You are being started on MOLNUPIRAVIR instead.

## 2022-11-13 NOTE — ED Triage Notes (Signed)
States felt generalized weakness when walking up stairs to house from mailbox, lost balance and hit right arm on railing. Large hematoma to right upper forearm. Dx with covid Sunday, on paxlovid & eliquis. UC sent here to r/o TIA. Pt denies stroke like symptoms. Stroke screen negative in triage.

## 2023-01-14 ENCOUNTER — Other Ambulatory Visit: Payer: Self-pay | Admitting: Cardiology

## 2023-01-15 NOTE — Telephone Encounter (Signed)
Prescription refill request for Eliquis received. Indication:Afib Last office visit:11/23 Scr:1.2  12/23 Age: 82 Weight:102.1  kg  Prescription refilled

## 2023-07-24 ENCOUNTER — Ambulatory Visit (HOSPITAL_COMMUNITY)
Admission: RE | Admit: 2023-07-24 | Discharge: 2023-07-24 | Disposition: A | Discharge status: Home / Self Care | Payer: Medicare HMO | Source: Ambulatory Visit | Attending: Physician Assistant | Admitting: Physician Assistant

## 2023-07-24 ENCOUNTER — Encounter (HOSPITAL_COMMUNITY): Payer: Self-pay | Admitting: Physician Assistant

## 2023-07-24 VITALS — BP 148/62 | HR 74 | Ht 71.0 in | Wt 236.2 lb

## 2023-07-24 DIAGNOSIS — Z7901 Long term (current) use of anticoagulants: Secondary | ICD-10-CM | POA: Insufficient documentation

## 2023-07-24 DIAGNOSIS — G4733 Obstructive sleep apnea (adult) (pediatric): Secondary | ICD-10-CM | POA: Diagnosis not present

## 2023-07-24 DIAGNOSIS — I10 Essential (primary) hypertension: Secondary | ICD-10-CM | POA: Diagnosis not present

## 2023-07-24 DIAGNOSIS — R9431 Abnormal electrocardiogram [ECG] [EKG]: Secondary | ICD-10-CM | POA: Diagnosis not present

## 2023-07-24 DIAGNOSIS — D6869 Other thrombophilia: Secondary | ICD-10-CM | POA: Diagnosis not present

## 2023-07-24 DIAGNOSIS — I480 Paroxysmal atrial fibrillation: Secondary | ICD-10-CM

## 2023-07-24 NOTE — Progress Notes (Signed)
Primary Care Physician: Roberts Gaudy., MD Primary Cardiologist: Dr Antoine Poche Primary Electrophysiologist: none Sleep: Dr Mayford Knife Referring Physician: Dr Eunice Blase is a 82 y.o. male with a history of HTN, OSA, atrial fibrillation who presents for follow up in the [facility].  The patient was initially diagnosed with atrial fibrillation 05/31/22 after presenting to the ED with symptoms of fatigue, SOB, and an irregular pulse. ECG showed afib with RVR. He was started on IV diltiazem which did convert him to SR. Patient was started on Eliquis for a CHADS2VASC score of 3 and oral diltiazem. Echo showed normal EF, no valvular issues. An event monitor was ordered at discharge which showed no afib.  On follow up today, patient reports that he has not had any interim symptoms of afib. No bleeding issues on anticoagulation. His primary issue currently is his back pain, has an appointment at [facility] for evaluation.   Today, he denies symptoms of palpitations, chest pain, shortness of breath, orthopnea, PND, lower extremity edema, dizziness, presyncope, syncope, bleeding, or neurologic sequela. The patient is tolerating medications without difficulties and is otherwise without complaint today.    Atrial Fibrillation Risk Factors:  he does have symptoms or diagnosis of sleep apnea. he does not have a history of rheumatic fever. he does have a history of alcohol use. The patient does not have a history of early familial atrial fibrillation or other arrhythmias.   Atrial Fibrillation Management history:  Previous antiarrhythmic drugs: none Previous cardioversions: none Previous ablations: none Anticoagulation history: Eliquis   Past Medical History:  Diagnosis Date   Arthritis    Chronic prostatitis    also with BPH   GERD (gastroesophageal reflux disease)    Hypertension    Sleep apnea    does not wear CPAP    Current Outpatient Medications   Medication Sig Dispense Refill   acetaminophen (TYLENOL) 650 MG CR tablet Take 1,300 mg by mouth 2 (two) times daily as needed (neck/shoulder pain).     diltiazem (CARDIZEM CD) 120 MG 24 hr capsule Take 1 capsule (120 mg total) by mouth daily. 30 capsule 1   DULoxetine (CYMBALTA) 60 MG capsule Take 60 mg by mouth daily.     ELIQUIS 5 MG TABS tablet TAKE 1 TABLET BY MOUTH TWICE A DAY 180 tablet 1   hydrochlorothiazide (HYDRODIURIL) 25 MG tablet Take by mouth.     lisinopril (ZESTRIL) 40 MG tablet Take 40 mg by mouth daily.     metoprolol tartrate (LOPRESSOR) 25 MG tablet Take by mouth in the morning and at bedtime.     omeprazole (PRILOSEC) 40 MG capsule Take 40 mg by mouth daily.     sildenafil (REVATIO) 20 MG tablet Take by mouth.     No current facility-administered medications for this encounter.    ROS- All systems are reviewed and negative except as per the HPI above.  Physical Exam: Vitals:   07/24/23 1405  BP: (!) 148/62  Pulse: 74  Weight: 107.1 kg  Height: 5\' 11"  (1.803 m)     GEN: Well nourished, well developed in no acute distress NECK: No JVD; No carotid bruits CARDIAC: Regular rate and rhythm, no murmurs, rubs, gallops RESPIRATORY:  Clear to auscultation without rales, wheezing or rhonchi  ABDOMEN: Soft, non-tender, non-distended EXTREMITIES:  No edema; No deformity    Wt Readings from Last 3 Encounters:  07/24/23 107.1 kg  11/13/22 102.1 kg  10/23/22 106 kg  EKG today demonstrates  SR Vent. rate 74 BPM PR interval 182 ms QRS duration 100 ms QT/QTcB 406/450 ms  Echo 05/31/22 demonstrated   1. Left ventricular ejection fraction, by estimation, is 55 to 60%. The  left ventricle has normal function. The left ventricle has no regional  wall motion abnormalities. The left ventricular internal cavity size was mildly dilated. Left ventricular diastolic parameters are consistent with Grade I diastolic dysfunction (impaired relaxation).   2. Right ventricular  systolic function is normal. The right ventricular  size is normal.   3. The mitral valve is normal in structure. Trivial mitral valve  regurgitation. No evidence of mitral stenosis.   4. The aortic valve is tricuspid. Aortic valve regurgitation is mild.  Aortic valve sclerosis/calcification is present, without any evidence of  aortic stenosis.   5. The inferior vena cava is normal in size with greater than 50%  respiratory variability, suggesting right atrial pressure of 3 mmHg.   Epic records are reviewed at length today  CHA2DS2-VASc Score = 3  The patient's score is based upon: CHF History: 0 HTN History: 1 Diabetes History: 0 Stroke History: 0 Vascular Disease History: 0 Age Score: 2 Gender Score: 0        ASSESSMENT AND PLAN: Paroxysmal Atrial Fibrillation (ICD10:  I48.0) The patient's CHA2DS2-VASc score is 3, indicating a 3.2% annual risk of stroke.   Patient appears to be maintaining SR Continue Eliquis 5 mg BID Continue diltiazem 120 mg daily Continue Lopressor 25 mg BID  Secondary Hypercoagulable State (ICD10:  D68.69) The patient is at significant risk for stroke/thromboembolism based upon his CHA2DS2-VASc Score of 3.  Continue Apixaban (Eliquis).   OSA  Encouraged nightly CPAP Followed by Dr Mayford Knife  HTN Stable on current regimen   Patient due for follow up with Dr Antoine Poche. AF clinic as needed.    Jorja Loa PA-C Afib Clinic [facility] [address] [place], [place] [address] [phone] 07/24/2023 2:35 PM

## 2023-09-21 ENCOUNTER — Other Ambulatory Visit: Payer: Self-pay | Admitting: Cardiology

## 2023-09-23 NOTE — Telephone Encounter (Signed)
Prescription refill request for Eliquis received. Indication:afib Last office visit:8/24 Scr:1.00  3/24 Age: 82 Weight:107.1  kg  Prescription refilled

## 2023-10-08 NOTE — Progress Notes (Signed)
  Cardiology Office Note:   Date:  10/11/2023  ID:  Vincent Rivas, DOB 07-28-1941, MRN [id number] PCP: Roberts Gaudy., MD  [facility] Providers Cardiologist:  Kaelan Amble  Sleep Medicine:  Armanda Magic, MD {  History of Present Illness:   Vincent Rivas is a 82 y.o. male who presents for follow up of atrial fib.  He has had atrial fib with RVR.  He presents for follow-up.  He still feeling palpitations but he does not think he is in atrial fibrillation.  He does not record any heart rates that are in the 110s or 120s are elevated.  He has his Apple Watch and can check his pulse and it is typically in the 60s.  He does however feel a lot of skipping beats.  He thought the beta-blocker would take care of this.  He is not having any presyncope or syncope.  He is not having any chest pressure, neck or arm discomfort.  He has had no weight gain or edema.  He has spinal stenosis and gets around with a walker with problems with his balance.  He has not had any falls.  ROS: As stated in the HPI and negative for all other systems.  Studies Reviewed:    EKG:   EKG Interpretation Date/Time:  Friday October 11 2023 14:49:35 EDT Ventricular Rate:  62 PR Interval:  186 QRS Duration:  104 QT Interval:  432 QTC Calculation: 438 R Axis:   -20  Text Interpretation: Sinus rhythm with occasional Premature ventricular complexes Minimal voltage criteria for LVH, may be normal variant ( R in aVL ) Nonspecific ST and T wave abnormality When compared with ECG of 24-Jul-2023 14:07, ectopy is new Confirmed by Rollene Rotunda ([id number]) on 10/11/2023 3:07:41 PM   We know okay because unlike that  Risk Assessment/Calculations:    CHA2DS2-VASc Score = 3   This indicates a 3.2% annual risk of stroke. The patient's score is based upon: CHF History: 0 HTN History: 1 Diabetes History: 0 Stroke History: 0 Vascular Disease History: 0 Age Score: 2 Gender Score: 0  Physical Exam:   VS:  BP 126/82   Pulse  73   Ht 5\' 11"  (1.803 m)   Wt 235 lb 3.2 oz (106.7 kg)   SpO2 98%   BMI 32.80 kg/m    Wt Readings from Last 3 Encounters:  10/11/23 235 lb 3.2 oz (106.7 kg)  07/24/23 236 lb 3.2 oz (107.1 kg)  11/13/22 225 lb (102.1 kg)     GEN: Well nourished, well developed in no acute distress NECK: No JVD; No carotid bruits CARDIAC: RRR, no murmurs, rubs, gallops RESPIRATORY:  Clear to auscultation without rales, wheezing or rhonchi  ABDOMEN: Soft, non-tender, non-distended EXTREMITIES:  No edema; No deformity   ASSESSMENT AND PLAN:   Atrial fib: He has had no symptomatic recurrence of this.  He does have some PVCs as documented above.  We talked about this.  He is going to check with the [employer] at the [employer] store and see if his watch can do rhythm strips.  He will remain on the beta-blocker.  He will monitor his heart rate.  He will continue the anticoagulation.  HTN: His blood pressure was at target.  No change in therapy.    OSA:   He is using CPAP.          Follow up me in 12 months.   Signed, Rollene Rotunda, MD

## 2023-10-11 ENCOUNTER — Encounter: Payer: Self-pay | Admitting: Cardiology

## 2023-10-11 ENCOUNTER — Ambulatory Visit: Payer: Medicare HMO | Attending: Cardiology | Admitting: Cardiology

## 2023-10-11 VITALS — BP 126/82 | HR 73 | Ht 71.0 in | Wt 235.2 lb

## 2023-10-11 DIAGNOSIS — I480 Paroxysmal atrial fibrillation: Secondary | ICD-10-CM | POA: Diagnosis not present

## 2023-10-11 DIAGNOSIS — G4730 Sleep apnea, unspecified: Secondary | ICD-10-CM

## 2023-10-11 DIAGNOSIS — I10 Essential (primary) hypertension: Secondary | ICD-10-CM | POA: Diagnosis not present

## 2023-10-11 NOTE — Patient Instructions (Signed)
    Follow-Up: At [facility], you and your health needs are our priority.  As part of our continuing mission to provide you with exceptional heart care, we have created designated Provider Care Teams.  These Care Teams include your primary Cardiologist (physician) and Advanced Practice Providers (APPs -  Physician Assistants and Nurse Practitioners) who all work together to provide you with the care you need, when you need it.  We recommend signing up for the patient portal called "MyChart".  Sign up information is provided on this After Visit Summary.  MyChart is used to connect with patients for Virtual Visits (Telemedicine).  Patients are able to view lab/test results, encounter notes, upcoming appointments, etc.  Non-urgent messages can be sent to your provider as well.   To learn more about what you can do with MyChart, go to [email or web address].    Your next appointment:   12 month(s)  Provider:   James Hochrein MD   

## 2023-10-24 IMAGING — CT CT HEAD W/O CM
3 of 4 series · 16 of 47 positions shown, 19 images · non-contrast
Comparison: None.

CLINICAL DATA: Head trauma, abnormal mental status. Patient fell on
concrete driveway.

EXAM:
CT HEAD WITHOUT CONTRAST
TECHNIQUE: Contiguous axial images were obtained from the base of the skull
through the vertex without intravenous contrast.

[Series 3: head 2.0 h70h · axial · 0.45mm/px · z∈[-177,-23]mm · 10 of 87 slices shown, 13 images]
[im 5/87  brain]
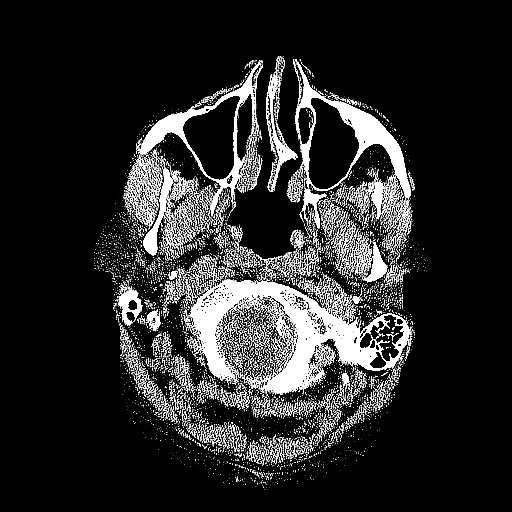
[im 5/87  bone]
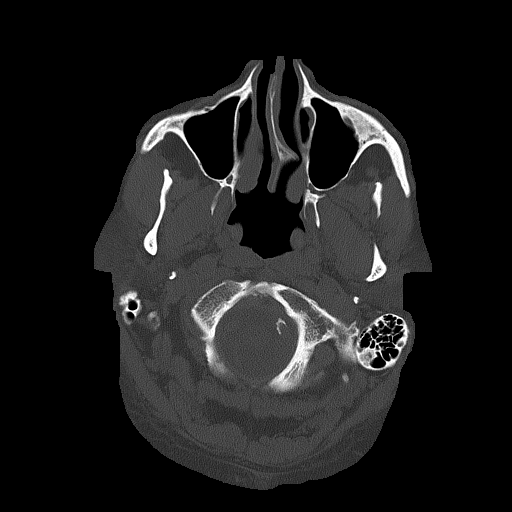
[im 13/87  brain]
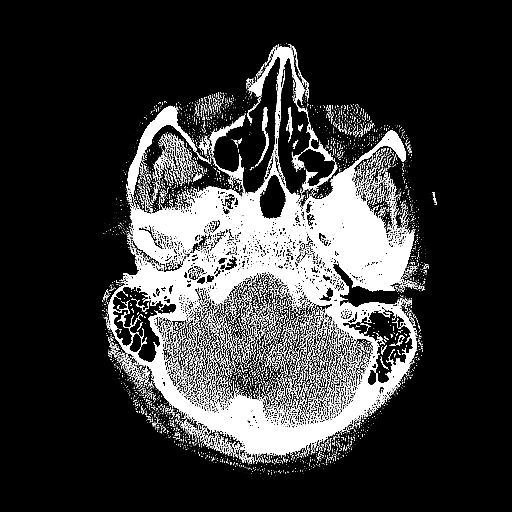
[im 22/87  brain]
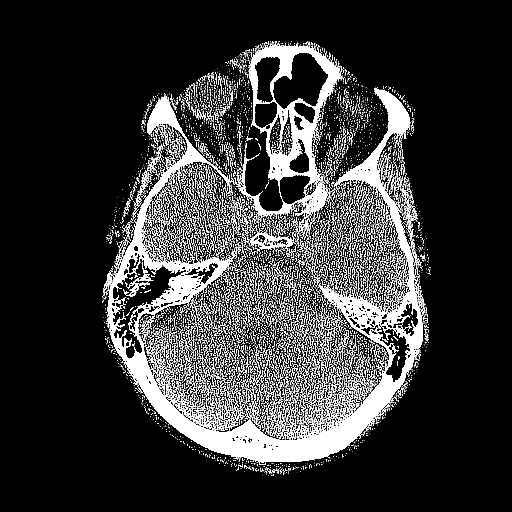
[im 31/87  brain]
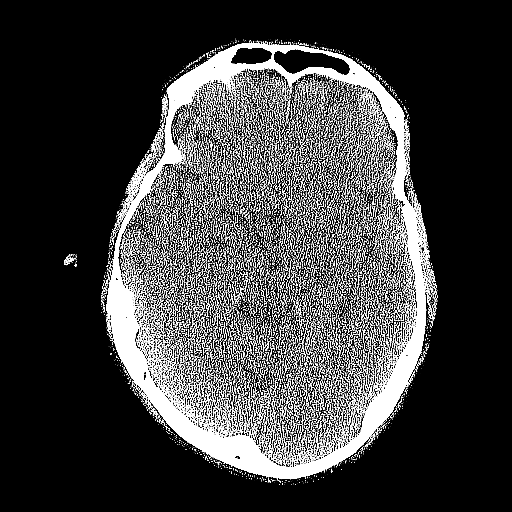
[im 39/87  brain]
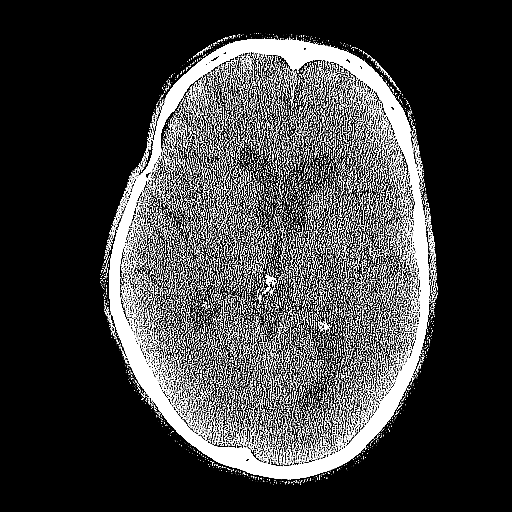
[im 39/87  bone]
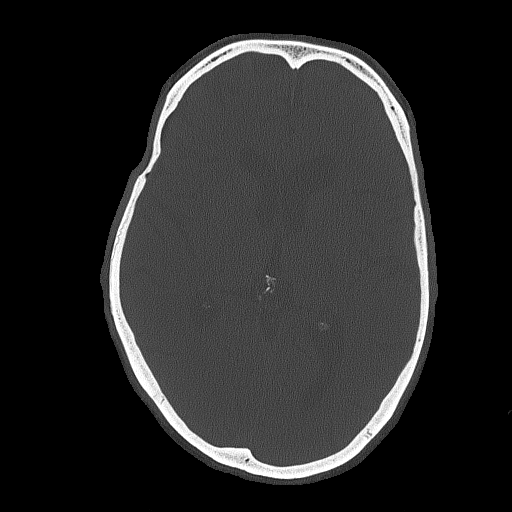
[im 48/87  brain]
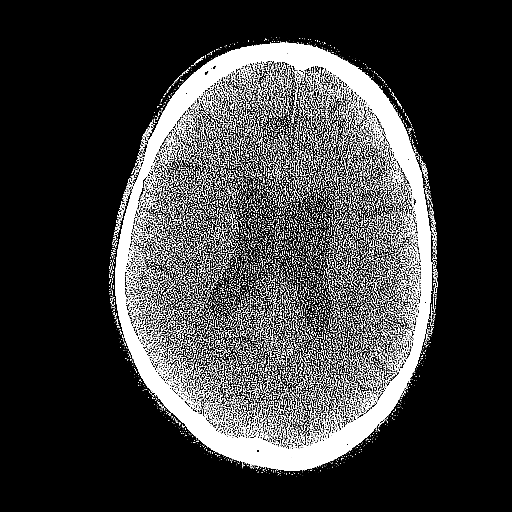
[im 56/87  brain]
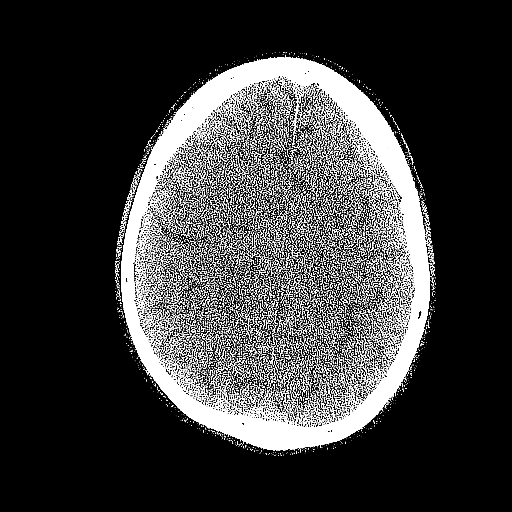
[im 65/87  brain]
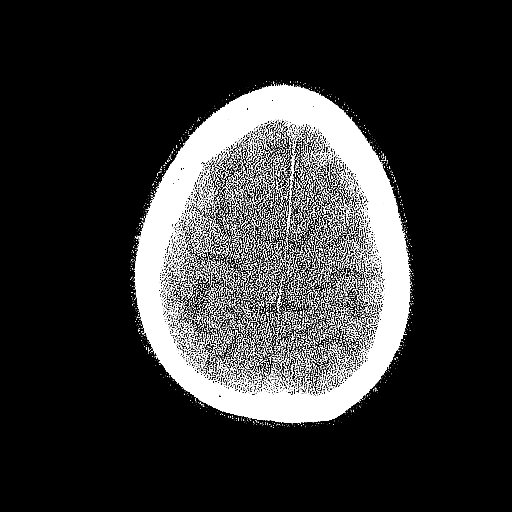
[im 74/87  brain]
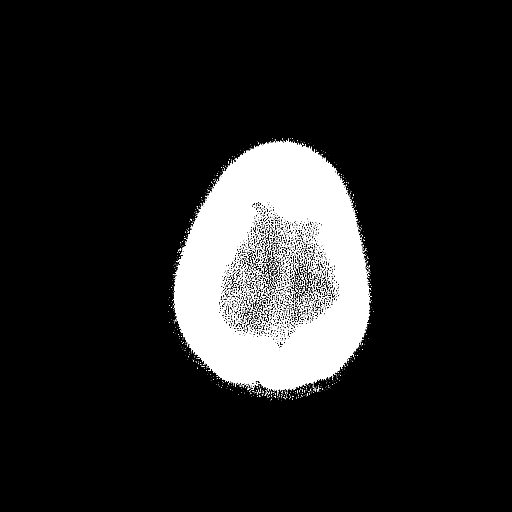
[im 74/87  bone]
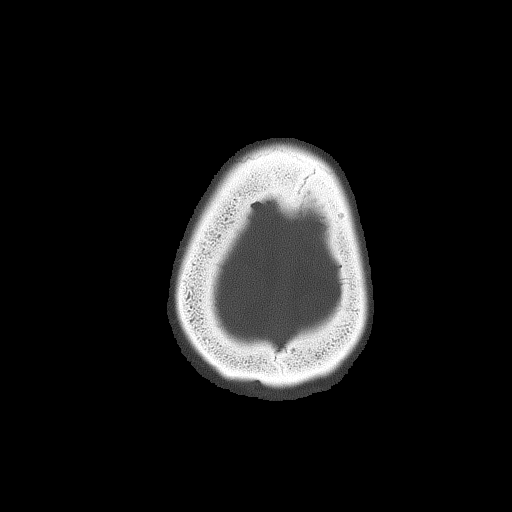
[im 82/87  brain]
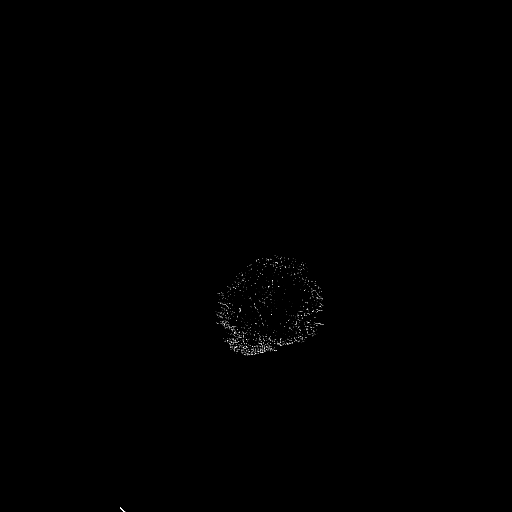

[Series 4: head 3.0 mpr cor · coronal · 0.36mm/px · 3 of 74 slices shown]
[im 25/74  brain]
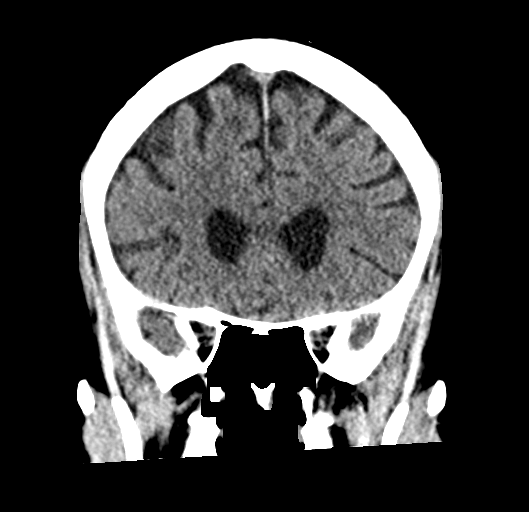
[im 33/74  brain]
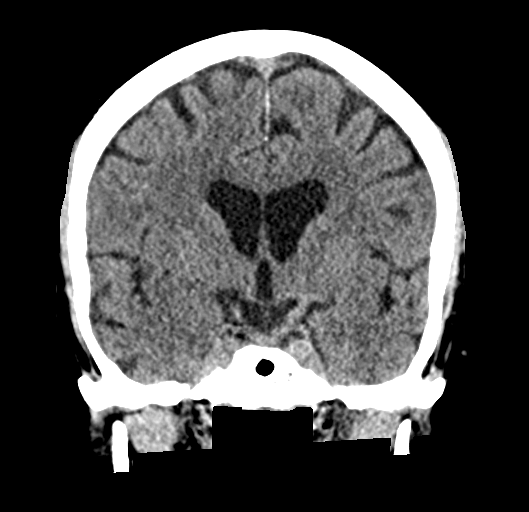
[im 41/74  brain]
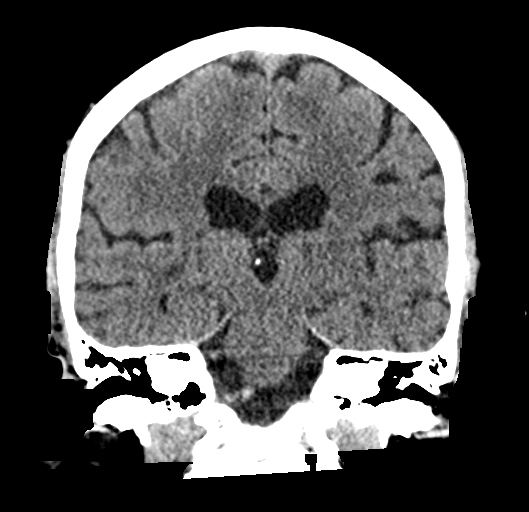

[Series 5: head 3.0 mpr sag · sagittal · 0.36mm/px · 3 of 63 slices shown]
[im 21/63  brain]
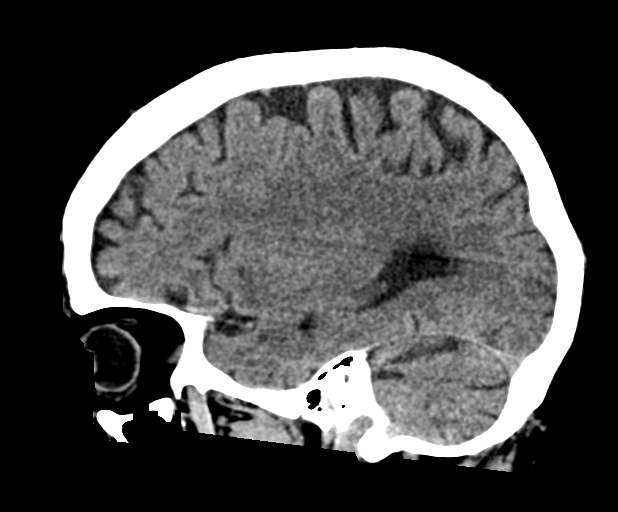
[im 32/63  brain]
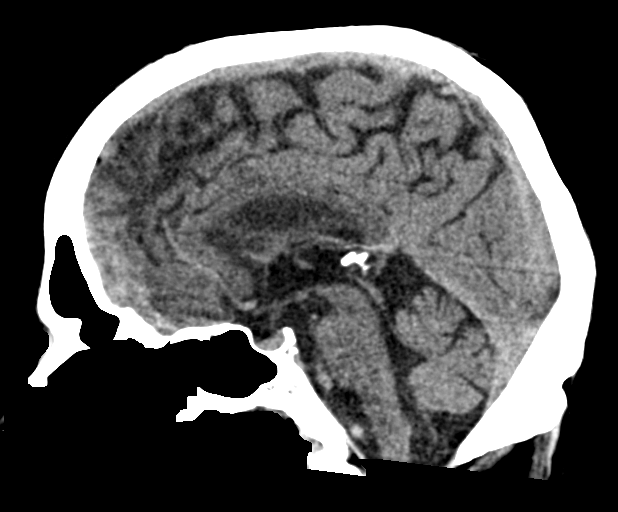
[im 42/63  brain]
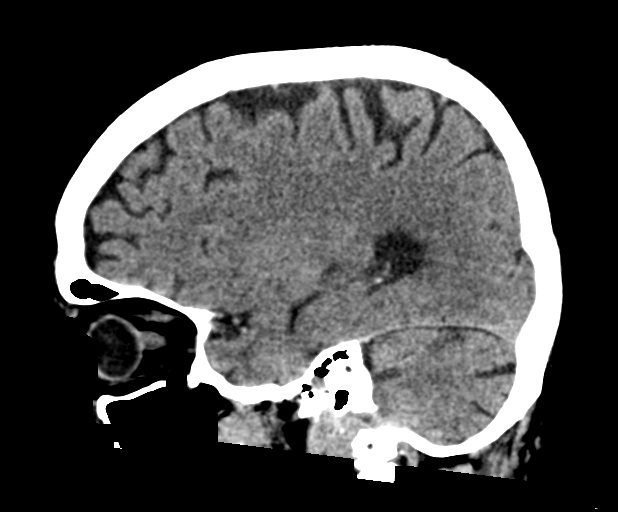

[16 of 47 positions shown; findings below may reference images not displayed]

FINDINGS: Brain: No evidence of acute infarction, hemorrhage, hydrocephalus,
extra-axial collection or mass lesion/mass effect. Scattered area of
low attenuation of the white matter presumed chronic microvascular
ischemic changes.

Vascular: No hyperdense vessel or unexpected calcification.

Skull: Normal. Negative for fracture or focal lesion.

Sinuses/Orbits: No acute finding.

Other: None.
IMPRESSION: No acute intracranial abnormality.

## 2023-10-24 IMAGING — DX DG ELBOW COMPLETE 3+V*L*
4 series · 4 of 4 positions shown · non-contrast
Comparison: None.

CLINICAL DATA: Left elbow pain after a fall.

EXAM:
LEFT ELBOW - COMPLETE 3+ VIEW

[elbow ap]
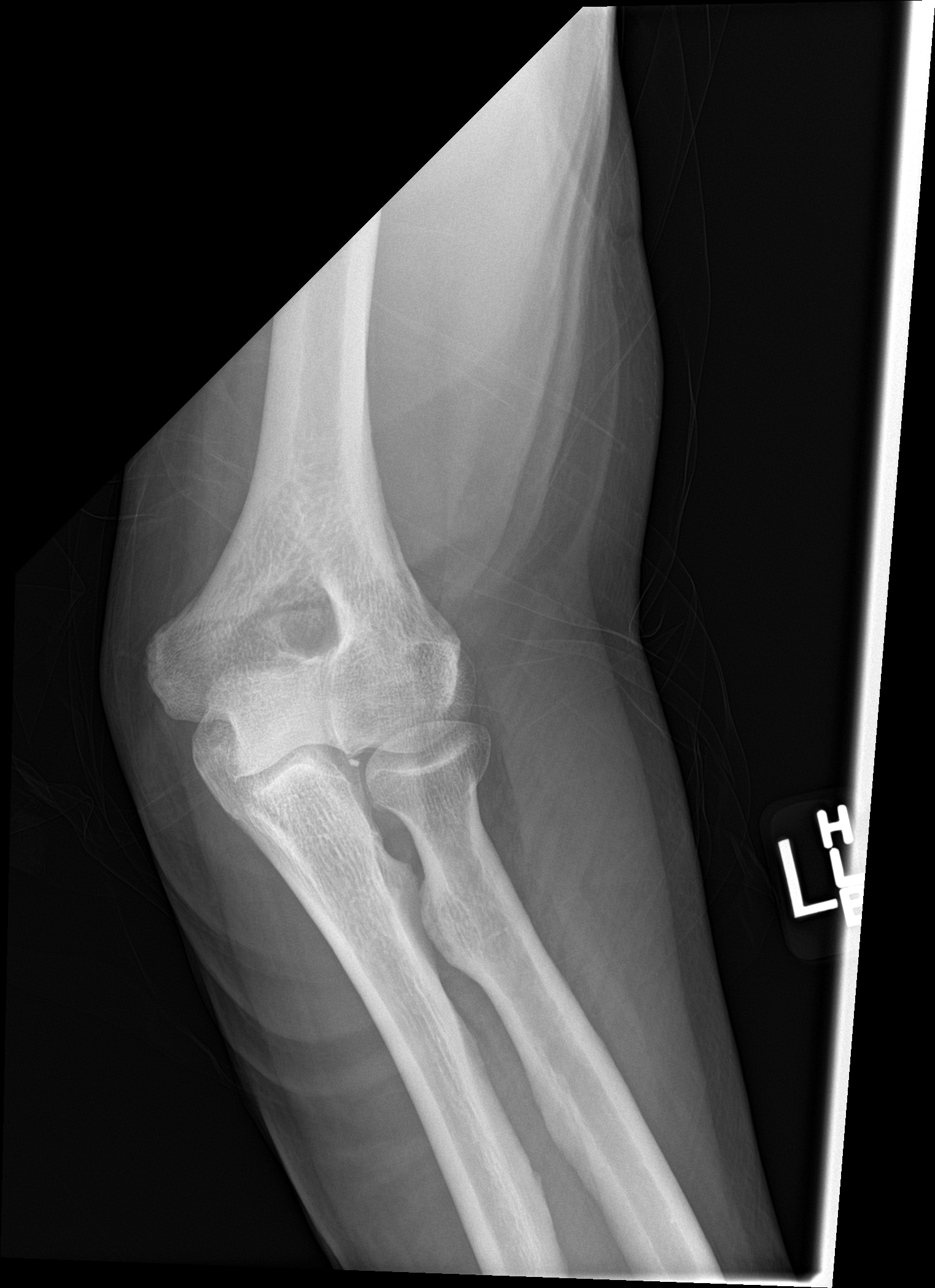

[elbow obl (1 of 2)]
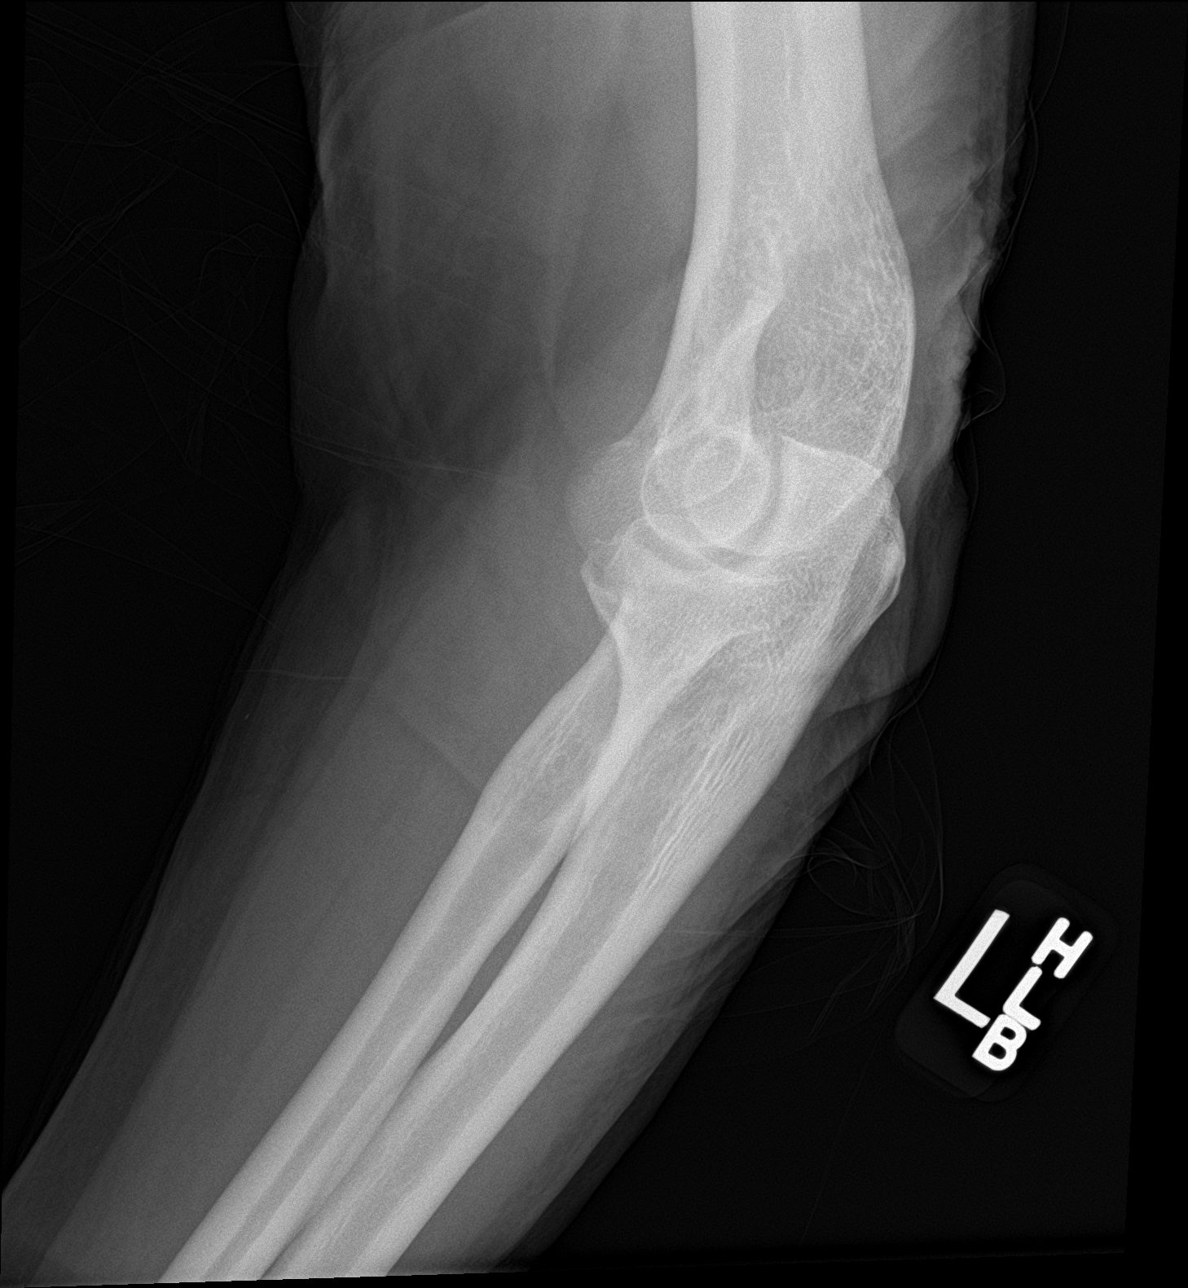

[elbow obl (2 of 2)]
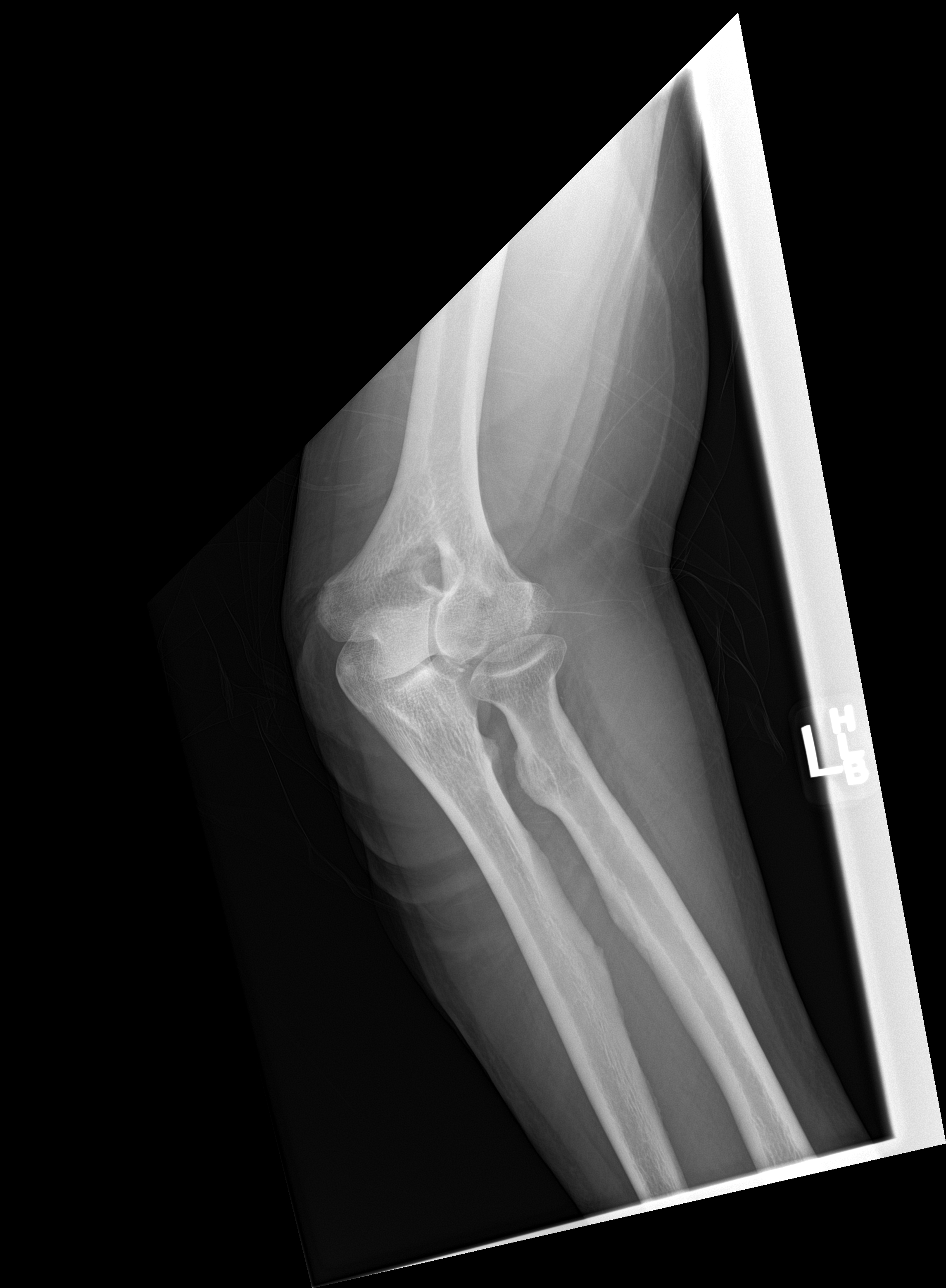

[elbow lat]
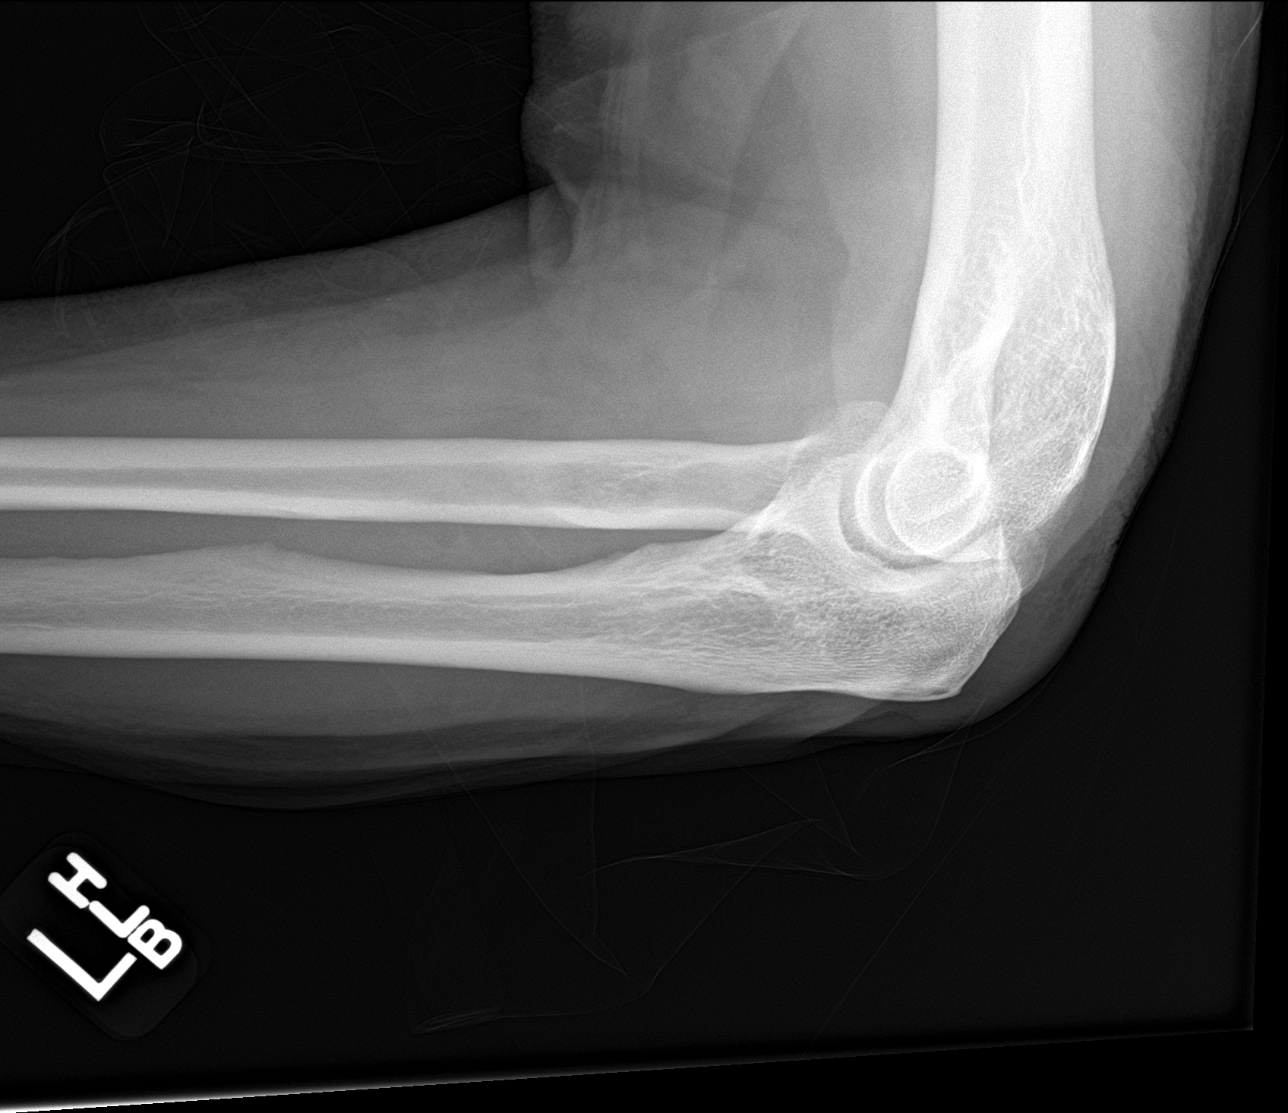

[4 of 4 positions shown; findings below may reference images not displayed]

FINDINGS: There is no evidence of fracture, dislocation, or joint effusion.
There is no evidence of arthropathy or other focal bone abnormality.
Soft tissues are unremarkable.
IMPRESSION: Negative.

## 2024-02-11 ENCOUNTER — Other Ambulatory Visit (HOSPITAL_BASED_OUTPATIENT_CLINIC_OR_DEPARTMENT_OTHER): Payer: Self-pay

## 2024-02-11 ENCOUNTER — Other Ambulatory Visit: Payer: Self-pay

## 2024-02-11 MED ORDER — DILTIAZEM HCL ER 120 MG PO CP24
120.0000 mg | ORAL_CAPSULE | Freq: Every day | ORAL | 4 refills | Status: AC
  Filled 2024-02-11: qty 90, 90d supply, fill #0
  Filled 2024-05-11: qty 90, 90d supply, fill #1
  Filled 2024-08-17: qty 90, 90d supply, fill #2
  Filled 2024-11-09: qty 90, 90d supply, fill #3

## 2024-02-11 MED ORDER — HYDROCODONE BIT-HOMATROP MBR 5-1.5 MG/5ML PO SOLN
5.0000 mL | Freq: Four times a day (QID) | ORAL | 0 refills | Status: DC | PRN
  Filled 2024-02-11: qty 120, 6d supply, fill #0

## 2024-02-11 MED ORDER — HYDROCHLOROTHIAZIDE 25 MG PO TABS
25.0000 mg | ORAL_TABLET | Freq: Every day | ORAL | 3 refills | Status: DC
  Filled 2024-02-11 – 2024-03-27 (×2): qty 90, 90d supply, fill #0
  Filled 2024-06-23 (×2): qty 90, 90d supply, fill #1

## 2024-02-11 MED ORDER — LISINOPRIL 40 MG PO TABS
40.0000 mg | ORAL_TABLET | Freq: Every day | ORAL | 4 refills | Status: DC
  Filled 2024-02-11: qty 90, 90d supply, fill #0
  Filled 2024-05-11: qty 90, 90d supply, fill #1
  Filled 2024-08-17: qty 90, 90d supply, fill #2

## 2024-02-11 MED ORDER — METOPROLOL TARTRATE 25 MG PO TABS
25.0000 mg | ORAL_TABLET | Freq: Two times a day (BID) | ORAL | 3 refills | Status: DC
  Filled 2024-02-11 – 2024-03-06 (×2): qty 180, 90d supply, fill #0
  Filled 2024-06-10: qty 180, 90d supply, fill #1

## 2024-02-11 MED ORDER — OSELTAMIVIR PHOSPHATE 75 MG PO CAPS
75.0000 mg | ORAL_CAPSULE | Freq: Two times a day (BID) | ORAL | 0 refills | Status: DC
  Filled 2024-02-11: qty 10, 5d supply, fill #0

## 2024-02-11 MED ORDER — ATORVASTATIN CALCIUM 10 MG PO TABS
10.0000 mg | ORAL_TABLET | Freq: Every day | ORAL | 11 refills | Status: DC
  Filled 2024-02-11: qty 90, 90d supply, fill #0
  Filled 2024-05-11: qty 90, 90d supply, fill #1

## 2024-02-11 MED ORDER — DICLOFENAC SODIUM 1 % EX GEL: 1.0000 | Freq: Four times a day (QID) | CUTANEOUS | 2 refills | Status: DC | PRN

## 2024-02-11 MED FILL — Apixaban Tab 5 MG: ORAL | 90 days supply | Qty: 180 | Fill #0 | Status: CN

## 2024-02-13 ENCOUNTER — Other Ambulatory Visit (HOSPITAL_BASED_OUTPATIENT_CLINIC_OR_DEPARTMENT_OTHER): Payer: Self-pay

## 2024-03-06 ENCOUNTER — Other Ambulatory Visit (HOSPITAL_BASED_OUTPATIENT_CLINIC_OR_DEPARTMENT_OTHER): Payer: Self-pay

## 2024-03-06 MED FILL — Apixaban Tab 5 MG: ORAL | 60 days supply | Qty: 120 | Fill #0 | Status: AC

## 2024-03-26 ENCOUNTER — Other Ambulatory Visit (HOSPITAL_BASED_OUTPATIENT_CLINIC_OR_DEPARTMENT_OTHER): Payer: Self-pay

## 2024-03-26 MED ORDER — KETOCONAZOLE 2 % EX SHAM
1.0000 | MEDICATED_SHAMPOO | CUTANEOUS | 11 refills | Status: DC
  Filled 2024-03-26: qty 120, 30d supply, fill #0

## 2024-03-27 ENCOUNTER — Other Ambulatory Visit (HOSPITAL_BASED_OUTPATIENT_CLINIC_OR_DEPARTMENT_OTHER): Payer: Self-pay

## 2024-04-06 IMAGING — DX DG CHEST 1V PORT
1 series · 1 of 1 positions shown · non-contrast
Comparison: 12/22/2016

CLINICAL DATA: Atrial fibrillation with rapid ventricular response

EXAM:
PORTABLE CHEST 1 VIEW

[chest ap]
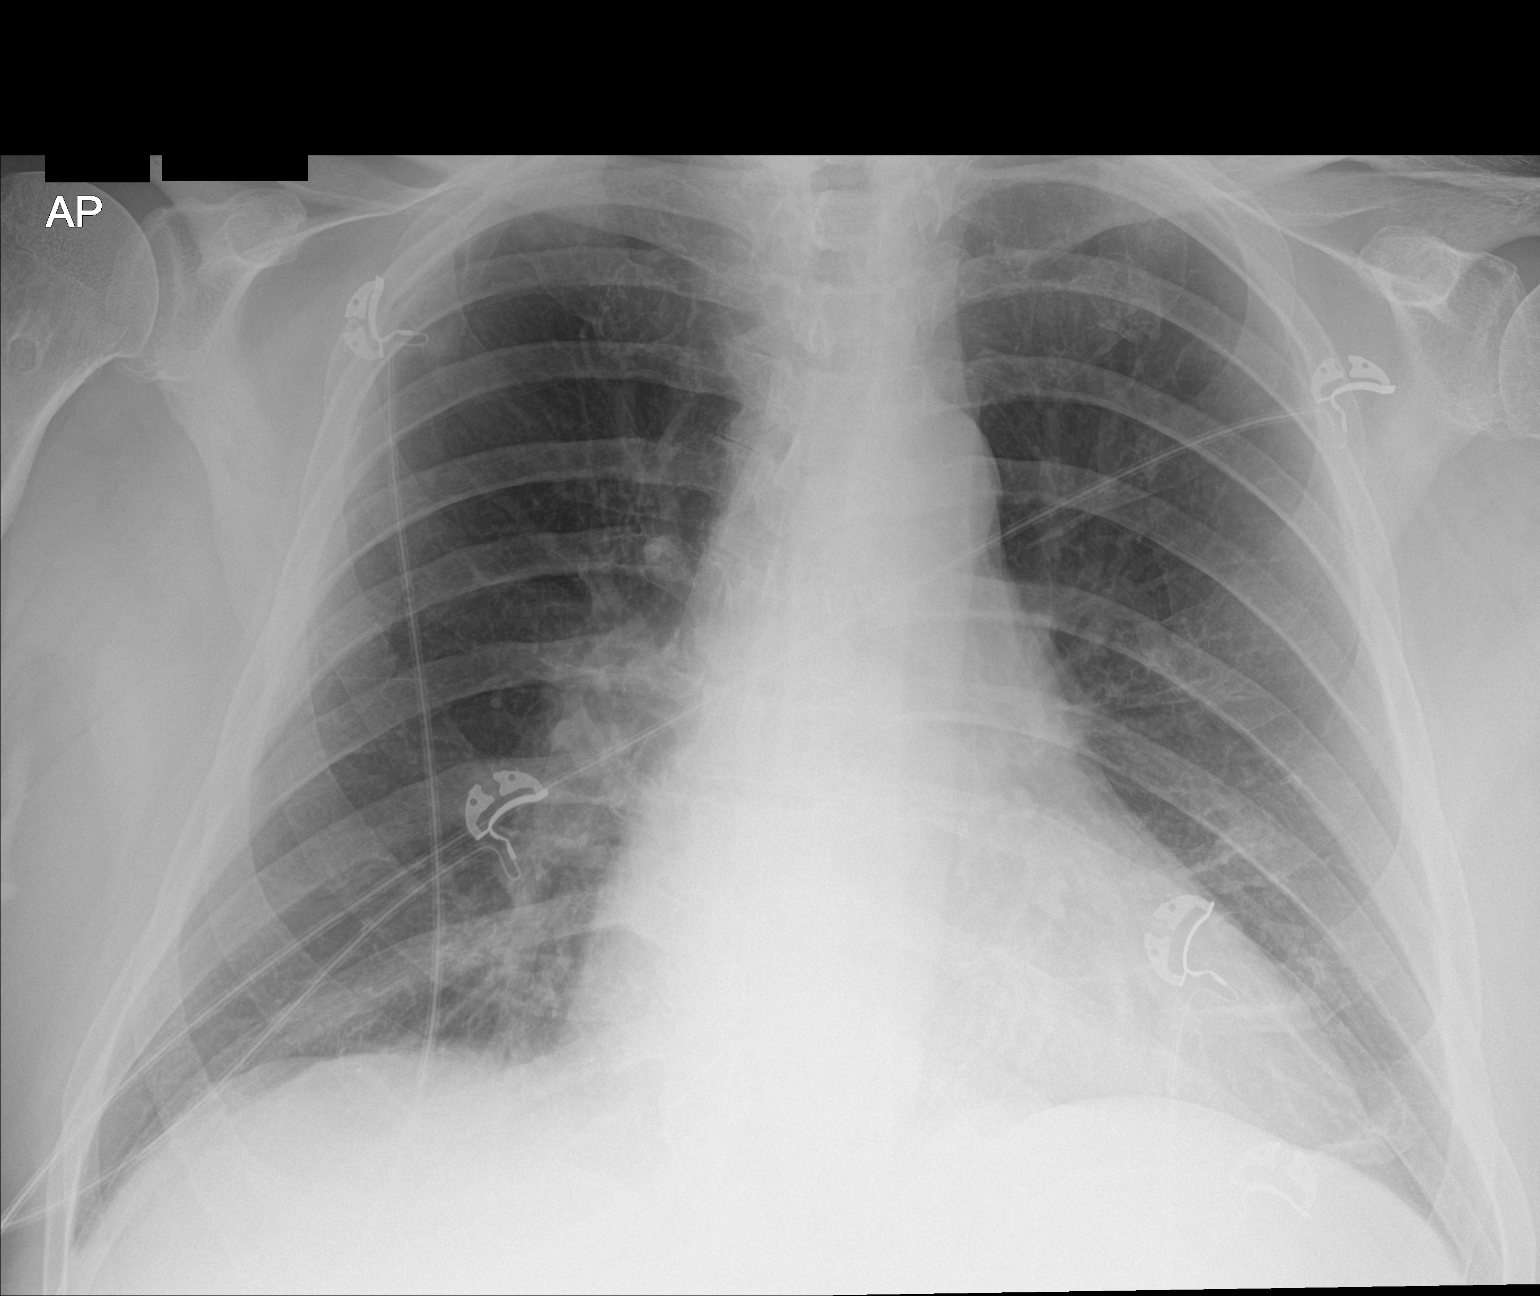

[1 of 1 positions shown; findings below may reference images not displayed]

FINDINGS: Borderline heart size accentuated by technique. Negative aortic and
hilar contours. There is no edema, consolidation, effusion, or
pneumothorax. Artifact from EKG leads.
IMPRESSION: No evidence of active disease.

## 2024-05-11 ENCOUNTER — Other Ambulatory Visit (HOSPITAL_BASED_OUTPATIENT_CLINIC_OR_DEPARTMENT_OTHER): Payer: Self-pay

## 2024-05-14 ENCOUNTER — Other Ambulatory Visit (HOSPITAL_BASED_OUTPATIENT_CLINIC_OR_DEPARTMENT_OTHER): Payer: Self-pay

## 2024-05-14 MED ORDER — DULOXETINE HCL 60 MG PO CPEP
60.0000 mg | ORAL_CAPSULE | Freq: Every day | ORAL | 2 refills | Status: DC
  Filled 2024-05-14: qty 90, 90d supply, fill #0
  Filled 2024-06-20 – 2024-06-23 (×2): qty 90, 90d supply, fill #1

## 2024-05-20 ENCOUNTER — Other Ambulatory Visit (HOSPITAL_BASED_OUTPATIENT_CLINIC_OR_DEPARTMENT_OTHER): Payer: Self-pay

## 2024-05-20 MED ORDER — ZOLPIDEM TARTRATE 5 MG PO TABS
5.0000 mg | ORAL_TABLET | Freq: Every evening | ORAL | 5 refills | Status: DC | PRN
  Filled 2024-05-20: qty 30, 30d supply, fill #0
  Filled 2024-07-09: qty 30, 30d supply, fill #1
  Filled 2024-08-28: qty 30, 30d supply, fill #2
  Filled 2024-10-05: qty 30, 30d supply, fill #3
  Filled 2024-11-04: qty 30, 30d supply, fill #4

## 2024-06-10 ENCOUNTER — Other Ambulatory Visit (HOSPITAL_BASED_OUTPATIENT_CLINIC_OR_DEPARTMENT_OTHER): Payer: Self-pay

## 2024-06-18 ENCOUNTER — Encounter (HOSPITAL_BASED_OUTPATIENT_CLINIC_OR_DEPARTMENT_OTHER): Payer: Self-pay | Admitting: Emergency Medicine

## 2024-06-18 ENCOUNTER — Emergency Department (HOSPITAL_BASED_OUTPATIENT_CLINIC_OR_DEPARTMENT_OTHER)

## 2024-06-18 ENCOUNTER — Other Ambulatory Visit: Payer: Self-pay

## 2024-06-18 ENCOUNTER — Emergency Department (HOSPITAL_BASED_OUTPATIENT_CLINIC_OR_DEPARTMENT_OTHER)
Admission: EM | Admit: 2024-06-18 | Discharge: 2024-06-18 | Disposition: A | Discharge status: Home / Self Care | Attending: Emergency Medicine | Admitting: Emergency Medicine

## 2024-06-18 DIAGNOSIS — R0602 Shortness of breath: Secondary | ICD-10-CM | POA: Diagnosis present

## 2024-06-18 DIAGNOSIS — I493 Ventricular premature depolarization: Secondary | ICD-10-CM | POA: Insufficient documentation

## 2024-06-18 DIAGNOSIS — Z7901 Long term (current) use of anticoagulants: Secondary | ICD-10-CM | POA: Insufficient documentation

## 2024-06-18 LAB — BASIC METABOLIC PANEL WITH GFR
Anion gap: 15 (ref 5–15)
BUN: 25 mg/dL — ABNORMAL HIGH (ref 8–23)
CO2: 19 mmol/L — ABNORMAL LOW (ref 22–32)
Calcium: 9.4 mg/dL (ref 8.9–10.3)
Chloride: 105 mmol/L (ref 98–111)
Creatinine, Ser: 1.2 mg/dL (ref 0.61–1.24)
GFR, Estimated: >60 mL/min (ref >60)
Glucose, Bld: 142 mg/dL — ABNORMAL HIGH (ref 70–99)
Potassium: 4.5 mmol/L (ref 3.5–5.1)
Sodium: 139 mmol/L (ref 135–145)

## 2024-06-18 LAB — MAGNESIUM: Magnesium: 2 mg/dL (ref 1.7–2.4)

## 2024-06-18 LAB — CBC
HCT: 43.9 % (ref 39.0–52.0)
Hemoglobin: 14.7 g/dL (ref 13.0–17.0)
MCH: 27.8 pg (ref 26.0–34.0)
MCHC: 33.5 g/dL (ref 30.0–36.0)
MCV: 83.1 fL (ref 80.0–100.0)
Platelets: 279 K/uL (ref 150–400)
RBC: 5.28 MIL/uL (ref 4.22–5.81)
RDW: 14 % (ref 11.5–15.5)
WBC: 6.9 K/uL (ref 4.0–10.5)
nRBC: 0 % (ref 0.0–0.2)

## 2024-06-18 LAB — TROPONIN T, HIGH SENSITIVITY
Troponin T High Sensitivity: 19 ng/L — ABNORMAL HIGH (ref <19)
Troponin T High Sensitivity: 17 ng/L (ref <19)

## 2024-06-18 NOTE — Discharge Instructions (Signed)
 You were seen for your palpitations in the emergency department.   At home, please continue your medications as prescribed.    Follow-up with your primary doctor in 2-3 days regarding your visit.  Cardiology will be calling you regarding an appointment within the next 72 hours.  You may contact them if you do not hear from them in that time using the information in this packet.  Please talk to them to see if you need an outpatient (Holter) monitor.  Return immediately to the emergency department if you experience any of the following: Chest pain, shortness of breath, fainting, or any other concerning symptoms.    Thank you for visiting our Emergency Department. It was a pleasure taking care of you today.

## 2024-06-18 NOTE — ED Triage Notes (Signed)
 Pt reports ShoB with minimal exertion with increased tiredness, denies pain, states feeling heart palpitations with every 4th beat missing, denies cough, hx of afib

## 2024-06-18 NOTE — ED Provider Notes (Signed)
 [facility] Provider Note   CSN: [id number] Arrival date & time: 06/18/24  1424     Patient presents with: Shortness of Breath and Palpitations   Vincent Rivas is a 83 y.o. male.   83 year old male with a history of atrial fibrillation on diltiazem  and Eliquis  who presents to the emergency department with palpitations, shortness of breath, and weakness for several days.  History obtained per the patient.  Says that over the past 4 days he has been having palpitations and dyspnea on exertion.  Says that his heart feels like he will skip every 4 beats.  No syncope.  Has been compliant with his medications including his Eliquis .  Not having any chest pain.  No leg swelling.  Drinks 1 glass of wine a day and 1 cup of coffee.  Says that he has been diagnosed with that he believes his PVCs in the past.       Prior to Admission medications   Medication Sig Start Date End Date Taking? Authorizing Provider  acetaminophen  (TYLENOL ) 650 MG CR tablet Take 1,300 mg by mouth 2 (two) times daily as needed (neck/shoulder pain).    [provider]  apixaban  (ELIQUIS ) 5 MG TABS tablet Take 1 tablet (5 mg total) by mouth 2 (two) times daily. 09/23/23   Lavona Agent, MD  atorvastatin  (LIPITOR) 10 MG tablet Take 1 tablet (10 mg total) by mouth daily. 11/19/23     diclofenac  Sodium (VOLTAREN ) 1 % GEL Apply 1 Application topically 4 (four) times daily as needed. 03/21/23     diltiazem  (CARDIZEM  CD) 120 MG 24 hr capsule Take 1 capsule (120 mg total) by mouth daily. 06/01/22   Barbarann Nest, MD  diltiazem  (DILACOR XR ) 120 MG 24 hr capsule Take 1 capsule (120 mg total) by mouth daily. 12/02/23     DULoxetine  (CYMBALTA ) 60 MG capsule Take 60 mg by mouth daily. 03/26/22   [provider]  DULoxetine  (CYMBALTA ) 60 MG capsule Take 1 capsule (60 mg total) by mouth daily. 05/14/24     hydrochlorothiazide  (HYDRODIURIL ) 25 MG tablet Take by mouth. 06/05/22    [provider]  hydrochlorothiazide  (HYDRODIURIL ) 25 MG tablet Take 1 tablet (25 mg total) by mouth daily. 07/31/23     HYDROcodone  bit-homatropine (HYCODAN) 5-1.5 MG/5ML syrup Take 5 mLs by mouth 4 (four) times daily as needed for up to 10 days. Max Daily Amount: 20 mLs. 02/11/24     ketoconazole  (NIZORAL ) 2 % shampoo Apply to scalp topically 2 (two) times a week. 03/26/24     lisinopril  (ZESTRIL ) 40 MG tablet Take 40 mg by mouth daily. 07/04/22   [provider]  lisinopril  (ZESTRIL ) 40 MG tablet Take 1 tablet (40 mg total) by mouth daily. 10/24/23     metoprolol  tartrate (LOPRESSOR ) 25 MG tablet Take by mouth in the morning and at bedtime. 06/05/22   [provider]  metoprolol  tartrate (LOPRESSOR ) 25 MG tablet Take 1 tablet (25 mg total) by mouth 2 (two) times daily. 07/31/23     omeprazole (PRILOSEC) 40 MG capsule Take 40 mg by mouth daily. 03/05/22   [provider]  oseltamivir  (TAMIFLU ) 75 MG capsule Take 1 capsule (75 mg total) by mouth 2 (two) times daily for 5 days. 02/10/24     sildenafil (REVATIO) 20 MG tablet Take by mouth. 02/20/17   [provider]  zolpidem  (AMBIEN ) 5 MG tablet Take 1 tablet (5 mg total) by mouth at bedtime as needed  for sleep. Max daily dose: 5 mg. 05/20/24       Allergies: Oxycodone , Pollen extract, and Neurontin  [gabapentin ]    Review of Systems  Updated Vital Signs BP (!) 163/59   Pulse 63   Temp (!) 97.4 F (36.3 C)   Resp 12   Ht 5' 11 (1.803 m)   Wt 102.1 kg   SpO2 98%   BMI 31.38 kg/m   Physical Exam Vitals and nursing note reviewed.  Constitutional:      General: He is not in acute distress.    Appearance: He is well-developed.  HENT:     Head: Normocephalic and atraumatic.     Right Ear: External ear normal.     Left Ear: External ear normal.     Nose: Nose normal.  Eyes:     Extraocular Movements: Extraocular movements intact.     Conjunctiva/sclera: Conjunctivae normal.     Pupils: Pupils are  equal, round, and reactive to light.  Cardiovascular:     Rate and Rhythm: Normal rate. Rhythm irregular.     Heart sounds: Normal heart sounds.  Pulmonary:     Effort: Pulmonary effort is normal. No respiratory distress.     Breath sounds: Normal breath sounds.  Musculoskeletal:     Cervical back: Normal range of motion and neck supple.     Right lower leg: No edema.     Left lower leg: No edema.  Skin:    General: Skin is warm and dry.  Neurological:     Mental Status: He is alert. Mental status is at baseline.  Psychiatric:        Mood and Affect: Mood normal.        Behavior: Behavior normal.     (all labs ordered are listed, but only abnormal results are displayed) Labs Reviewed  BASIC METABOLIC PANEL WITH GFR - Abnormal; Notable for the following components:      Result Value   CO2 19 (*)    Glucose, Bld 142 (*)    BUN 25 (*)    All other components within normal limits  TROPONIN T, HIGH SENSITIVITY - Abnormal; Notable for the following components:   Troponin T High Sensitivity 19 (*)    All other components within normal limits  CBC  MAGNESIUM   TROPONIN T, HIGH SENSITIVITY    EKG: EKG Interpretation Date/Time:  Thursday June 18 2024 14:36:45 EDT Ventricular Rate:  69 PR Interval:  175 QRS Duration:  143 QT Interval:  426 QTC Calculation: 457 R Axis:   -39  Text Interpretation: Sinus rhythm Left bundle branch block Baseline wander in lead(s) V2 Confirmed by Yolande Charleston [phone]) on 06/18/2024 2:52:41 PM  Radiology: DG Chest 2 View Result Date: 06/18/2024 CLINICAL DATA:  Shortness of breath. EXAM: CHEST - 2 VIEW COMPARISON:  Chest radiograph dated 05/31/2022. FINDINGS: No focal consolidation, effusion, or pneumothorax. Stable cardiac silhouette. No acute osseous pathology. IMPRESSION: No active cardiopulmonary disease. Electronically Signed   By: Vanetta Chou M.D.   On: 06/18/2024 15:40     Procedures   Medications Ordered in the ED - No data to  display                                  Medical Decision Making Amount and/or Complexity of Data Reviewed Labs: ordered. Radiology: ordered.   83 year old male with a history of atrial fibrillation on diltiazem  and Eliquis  who presents to  the emergency department with palpitations, shortness of breath, and weakness for several days.    Initial Ddx:  Arrhythmia, PVCs, electrolyte abnormality, MI, PE  MDM:  Feel the patient is likely having a paroxysmal arrhythmia given their symptoms.  Is overall well-appearing on exam.  Is having frequent PVCs on the monitor.  Will obtain EKG to evaluate for signs of malignant arrhythmia.  Will also keep the patient on telemetry while in the emergency department to monitor for events.  Considered PE but feel this is less likely since he is already on 5 mg twice daily of Eliquis  and does not have any symptoms of DVT.  With his weakness and dyspnea we will also obtain troponins to assess for MI but feel this is less likely.  Plan:  Labs Electrolytes EKG Chest x-ray  ED Summary/Re-evaluation:  Patient monitored while in the emergency department and remained stable.  Was having frequent PVCs as well as ventricular trigeminy at times.  Reviewed telemetry which did not show signs of malignant arrhythmia.  EKG reviewed and did not show signs of ARVC, HOCM, Brugada, long QT, WPW, or other concerning features.  Serial troponins reassuring.  Labs were unremarkable.  Ambulatory referral to cardiology placed for evaluation.  Is already on metoprolol  and has a low heart rate so we will hold off on starting any additional medications at this time.  This patient presents to the ED for concern of complaints listed in HPI, this involves an extensive number of treatment options, and is a complaint that carries with it a high risk of complications and morbidity. Disposition including potential need for admission considered.   Dispo: DC Home. Return precautions discussed  including, but not limited to, those listed in the AVS. Allowed pt time to ask questions which were answered fully prior to dc.  Additional history obtained from daughter Records reviewed Outpatient Clinic Notes The following labs were independently interpreted: Chemistry and show no acute abnormality I independently reviewed the following imaging with scope of interpretation limited to determining acute life threatening conditions related to emergency care: Chest x-ray and agree with the radiologist interpretation with the following exceptions: none I personally reviewed and interpreted cardiac monitoring: Sinus rhythm with occasional PVCs I personally reviewed and interpreted the pt's EKG: see above for interpretation  I have reviewed the patients home medications and made adjustments as needed Social Determinants of health:  Geriatric   Final diagnoses:  PVC (premature ventricular contraction)  Shortness of breath    ED Discharge Orders          Ordered    Ambulatory referral to Cardiology        06/18/24 1728               Yolande Lamar BROCKS, MD 06/18/24 1931

## 2024-06-20 ENCOUNTER — Other Ambulatory Visit (HOSPITAL_COMMUNITY): Payer: Self-pay

## 2024-06-20 MED FILL — Apixaban Tab 5 MG: ORAL | 30 days supply | Qty: 60 | Fill #1 | Status: CN

## 2024-06-22 ENCOUNTER — Other Ambulatory Visit: Payer: Self-pay

## 2024-06-22 ENCOUNTER — Other Ambulatory Visit (HOSPITAL_COMMUNITY): Payer: Self-pay

## 2024-06-23 ENCOUNTER — Other Ambulatory Visit: Payer: Self-pay

## 2024-06-23 ENCOUNTER — Other Ambulatory Visit (HOSPITAL_BASED_OUTPATIENT_CLINIC_OR_DEPARTMENT_OTHER): Payer: Self-pay

## 2024-06-23 ENCOUNTER — Other Ambulatory Visit (HOSPITAL_COMMUNITY): Payer: Self-pay

## 2024-06-23 MED FILL — Apixaban Tab 5 MG: ORAL | 30 days supply | Qty: 60 | Fill #1 | Status: CN

## 2024-06-23 MED FILL — Apixaban Tab 5 MG: ORAL | 30 days supply | Qty: 60 | Fill #1 | Status: AC

## 2024-07-07 ENCOUNTER — Ambulatory Visit

## 2024-07-07 ENCOUNTER — Ambulatory Visit: Attending: Cardiology | Admitting: Cardiology

## 2024-07-07 ENCOUNTER — Encounter: Payer: Self-pay | Admitting: Cardiology

## 2024-07-07 ENCOUNTER — Other Ambulatory Visit (HOSPITAL_BASED_OUTPATIENT_CLINIC_OR_DEPARTMENT_OTHER): Payer: Self-pay

## 2024-07-07 VITALS — BP 140/64 | HR 60 | Ht 71.0 in | Wt 233.0 lb

## 2024-07-07 DIAGNOSIS — I493 Ventricular premature depolarization: Secondary | ICD-10-CM

## 2024-07-07 DIAGNOSIS — R0600 Dyspnea, unspecified: Secondary | ICD-10-CM | POA: Diagnosis not present

## 2024-07-07 DIAGNOSIS — I480 Paroxysmal atrial fibrillation: Secondary | ICD-10-CM

## 2024-07-07 DIAGNOSIS — I10 Essential (primary) hypertension: Secondary | ICD-10-CM | POA: Diagnosis not present

## 2024-07-07 MED ORDER — APIXABAN 5 MG PO TABS
5.0000 mg | ORAL_TABLET | Freq: Two times a day (BID) | ORAL | 2 refills | Status: AC
  Filled 2024-07-07: qty 180, 90d supply, fill #0
  Filled 2024-07-22: qty 60, 30d supply, fill #0
  Filled 2024-07-22 (×2): qty 180, 90d supply, fill #0
  Filled 2024-08-28: qty 60, 30d supply, fill #1
  Filled 2024-09-25: qty 60, 30d supply, fill #2
  Filled 2024-10-27: qty 60, 30d supply, fill #3
  Filled 2024-11-27: qty 60, 30d supply, fill #4
  Filled 2024-12-28: qty 60, 30d supply, fill #5

## 2024-07-07 MED ORDER — METOPROLOL SUCCINATE ER 100 MG PO TB24
100.0000 mg | ORAL_TABLET | Freq: Every day | ORAL | 3 refills | Status: DC
  Filled 2024-07-07: qty 90, 90d supply, fill #0

## 2024-07-07 NOTE — Patient Instructions (Signed)
 Medication Instructions:  START METOPROLOL  SUCCINATE 100 MG DAILY  TAKE METOPROLOL  TARTRATE 25 MG AS NEEDED FOR BREAKTHROUGH PALPITATIONS *If you need a refill on your cardiac medications before your next appointment, please call your pharmacy*  Lab Work: NO LABS If you have labs (blood work) drawn today and your tests are completely normal, you will receive your results only by: MyChart Message (if you have MyChart) OR A paper copy in the mail If you have any lab test that is abnormal or we need to change your treatment, we will call you to review the results.  Testing/Procedures:[address] Your physician has requested that you have an echocardiogram. Echocardiography is a painless test that uses sound waves to create images of your heart. It provides your doctor with information about the size and shape of your heart and how well your heart's chambers and valves are working. This procedure takes approximately one hour. There are no restrictions for this procedure. Please do NOT wear cologne, perfume, aftershave, or lotions (deodorant is allowed). Please arrive 15 minutes prior to your appointment time.  Please note: We ask at that you not bring children with you during ultrasound (echo/ vascular) testing. Due to room size and safety concerns, children are not allowed in the ultrasound rooms during exams. Our front office staff cannot provide observation of children in our lobby area while testing is being conducted. An adult accompanying a patient to their appointment will only be allowed in the ultrasound room at the discretion of the ultrasound technician under special circumstances. We apologize for any inconvenience.     Your cardiac CT will be scheduled at one of the below locations:   [facility] [address] [place], [place] [address] [phone]  [facility] [address]  [place], [place] [address]  If scheduled at [facility], please arrive at the [facility] and Children's Entrance (Entrance C2) of [facility] 30 minutes prior to test start time. You can use the FREE valet parking offered at entrance C (encouraged to control the heart rate for the test)  Proceed to the [facility] Radiology Department (first floor) to check-in and test prep.  All radiology patients and guests should use entrance C2 at [facility], accessed from [facility], even though the hospital's physical address listed is [address]  If scheduled at the Heart and Vascular Tower at [address], please enter the parking lot using the [address] entrance and use the FREE valet service at the patient drop-off area. Enter the building and check-in with registration on the main floor.  Please follow these instructions carefully (unless otherwise directed):  An IV will be required for this test and Nitroglycerin will be given.  Hold all erectile dysfunction medications at least 3 days (72 hrs) prior to test. (Ie viagra, cialis, sildenafil, tadalafil, etc)   On the Night Before the Test: Be sure to Drink plenty of water. Do not consume any caffeinated/decaffeinated beverages or chocolate 12 hours prior to your test. Do not take any antihistamines 12 hours prior to your test.  On the Day of the Test: Drink plenty of water until 1 hour prior to the test. Do not eat any food 1 hour prior to test. You may take your regular medications prior to the test.  Hold Metoprolol  Tartrate the morning of procedure. If you take Hydrochlorothiazide  please HOLD on the morning of the test. Patients who wear a  continuous glucose monitor MUST remove the device prior to scanning.      After the Test: Drink plenty of water. After receiving IV contrast, you may experience a mild flushed feeling. This is normal. On occasion, you may experience a mild rash up to 24 hours after the test. This is not dangerous. If  this occurs, you can take Benadryl 25 mg, Zyrtec, Claritin, or Allegra and increase your fluid intake. (Patients taking Tikosyn should avoid Benadryl, and may take Zyrtec, Claritin, or Allegra) If you experience trouble breathing, this can be serious. If it is severe call 911 IMMEDIATELY. If it is mild, please call our office.  We will call to schedule your test 2-4 weeks out understanding that some insurance companies will need an authorization prior to the service being performed.   For more information and frequently asked questions, please visit our website : [email or web address]  For non-scheduling related questions, please contact the cardiac imaging nurse navigator should you have any questions/concerns: Cardiac Imaging Nurse Navigators Direct Office Dial: [phone]   For scheduling needs, including cancellations and rescheduling, please call [place], [phone].   Follow-Up: At [facility], you and your health needs are our priority.  As part of our continuing mission to provide you with exceptional heart care, our providers are all part of one team.  This team includes your primary Cardiologist (physician) and Advanced Practice Providers or APPs (Physician Assistants and Nurse Practitioners) who all work together to provide you with the care you need, when you need it.  Your next appointment:   2 month(s)  Provider:   Lynwood Schilling, MD or ANY APP   Other Instructions ZIO XT- Long Term Monitor Instructions  Your physician has requested you wear a ZIO patch monitor for 7 days.  This is a single patch monitor. [employer] supplies one patch monitor per enrollment. Additional stickers are not available. Please do not apply patch if you will be having a Nuclear Stress Test,  Echocardiogram, Cardiac CT, MRI, or Chest Xray during the period you would be wearing the  monitor. The patch cannot be worn during these tests. You cannot remove and re-apply the  ZIO  XT patch monitor.  Your ZIO patch monitor will be mailed 3 day USPS to your address on file. It may take 3-5 days  to receive your monitor after you have been enrolled.  Once you have received your monitor, please review the enclosed instructions. Your monitor  has already been registered assigning a specific monitor serial # to you.  Billing and Patient Assistance Program Information  We have supplied [employer] with any of your insurance information on file for billing purposes. [employer] offers a sliding scale Patient Assistance Program for patients that do not have  insurance, or whose insurance does not completely cover the cost of the ZIO monitor.  You must apply for the Patient Assistance Program to qualify for this discounted rate.  To apply, please call [employer] at [phone], select option 4, select option 2, ask to apply for  Patient Assistance Program. [place] will ask your household income, and how many people  are in your household. They will quote your out-of-pocket cost based on that information.  [employer] will also be able to set up a 39-month, interest-free payment plan if needed.  Applying the monitor   Shave hair from upper left chest.  Hold abrader disc by orange tab. Rub abrader in 40 strokes over the upper left chest as  indicated in your monitor instructions.  Clean area with 4 enclosed alcohol pads. Let dry.  Apply patch as indicated in monitor instructions. Patch will be placed under collarbone on left  side of chest with arrow pointing upward.  Rub patch adhesive wings for 2 minutes. Remove white label marked 1. Remove the white  label marked 2. Rub patch adhesive wings for 2 additional minutes.  While looking in a mirror, press and release button in center of patch. A small green light will  flash 3-4 times. This will be your only indicator that the monitor has been turned on.  Do not shower for the first 24 hours. You may shower after the first 24 hours.   Press the button if you feel a symptom. You will hear a small click. Record Date, Time and  Symptom in the Patient Logbook.  When you are ready to remove the patch, follow instructions on the last 2 pages of Patient  Logbook. Stick patch monitor onto the last page of Patient Logbook.  Place Patient Logbook in the blue and white box. Use locking tab on box and tape box closed  securely. The blue and white box has prepaid postage on it. Please place it in the mailbox as  soon as possible. Your physician should have your test results approximately 7 days after the  monitor has been mailed back to [facility].  Call [facility] Customer Care at [phone] if you have questions regarding  your ZIO XT patch monitor. Call them immediately if you see an orange light blinking on your  monitor.  If your monitor falls off in less than 4 days, contact our Monitor department at [phone].  If your monitor becomes loose or falls off after 4 days call [employer] at [phone] for  suggestions on securing your monitor

## 2024-07-07 NOTE — H&P (View-Only) (Signed)
 Cardiology Office Note:   Date:  07/07/2024  ID:  Vincent Rivas, DOB Sep 23, 1941, MRN [id number] PCP: Geofm Franky CROME., MD  [facility] Providers Cardiologist:  Lynwood Schilling, MD Sleep Medicine:  Wilbert Bihari, MD { Click to update primary MD,subspecialty MD or APP then REFRESH:1}   History of Present Illness:   Discussed the use of AI scribe software for clinical note transcription with the patient, who gave verbal consent to proceed.  History of Present Illness Vincent Rivas is an 83 year old male with atrial fibrillation and hypertension who presents with palpitations and shortness of breath.  He has a history of atrial fibrillation with rapid ventricular response, initially diagnosed in June 2023 after experiencing fatigue, shortness of breath, and an irregular pulse. An EKG confirmed atrial fibrillation with rapid ventricular response, and he was treated with IV diltiazem , converting to normal sinus rhythm. He was started on Eliquis  and oral diltiazem .  In November 2024, during a routine follow-up, he reported some symptoms of palpitations, but his Apple Watch showed no clear evidence of atrial fibrillation. No significant medication changes were made at that time. On June 18, 2024, he presented to the emergency department with palpitations, shortness of breath, and weakness for several days. He reported palpitations and shortness of breath with exertion, noting his heart felt like it was skipping every four beats. No syncope was noted, and he confirmed compliance with his medications. He reports that in the emergency department, he was told there were no significant lab abnormalities, but that frequent PVCs were seen on the monitor.  Today he describes ongoing PVCs. He reports increased stress and anxiety since his wife's passing earlier in the year. He self-managed his PVCs since visiting the ED with metoprolol  tartrate, taking 25 mg four times a day, adjusting the timing based on  stress or tension. This regimen has been effective in preventing PVCs, allowing him to go several mornings without them.  He still experiences shortness of breath with minimal exertion, such as walking to the mailbox, feels this has become worse in the last few weeks. He now limits his activities to avoid exertion, noting that he sometimes becomes clammy and out of breath with minimal activity. No chest pain, dizziness, or lightheadedness. He mentions clumsiness and balance issues, for which he uses a rollator at home. He has a history of neuropathy, which affects his balance. He notes swelling in his left upper arm, which he attributes to past cervical impingement, but denies recent trauma or overuse.   In clinic today patient is without focal symptoms.     Studies Reviewed:    EKG:     ED ECG reviewed. Newly noted LBBB with HR 69, sinus rhythm. Previous ECGs with similar axis, slightly more narrow QRS not meeting LBBB criteria.   Risk Assessment/Calculations:    CHA2DS2-VASc Score = 3  {Confirm score is correct.  If not, click here to update score.  REFRESH note.  :1} This indicates a 3.2% annual risk of stroke. The patient's score is based upon: CHF History: 0 HTN History: 1 Diabetes History: 0 Stroke History: 0 Vascular Disease History: 0 Age Score: 2 Gender Score: 0   {This patient has a significant risk of stroke if diagnosed with atrial fibrillation.  Please consider VKA or DOAC agent for anticoagulation if the bleeding risk is acceptable.   You can also use the SmartPhrase .[email or web address] for documentation.   :[id number]} HYPERTENSION CONTROL Vitals:   07/07/24 1025 07/07/24 1143  BP: ROLLEN)  140/62 (!) 140/64    The patient's blood pressure is elevated above target today. {Click here if intervention needs to be changed Refresh Note :1}  In order to address the patient's elevated BP: Blood pressure will be monitored at home to determine if medication changes need to be made.       STOP-Bang Score:     { Consider Dx Sleep Disordered Breathing or Sleep Apnea  ICD G47.33          :1}     Physical Exam:   VS:  BP (!) 140/64   Pulse 60   Ht 5' 11 (1.803 m)   Wt 233 lb (105.7 kg)   SpO2 97%   BMI 32.50 kg/m    Wt Readings from Last 3 Encounters:  07/07/24 233 lb (105.7 kg)  06/18/24 225 lb (102.1 kg)  10/11/23 235 lb 3.2 oz (106.7 kg)     Physical Exam Vitals reviewed.  Constitutional:      Appearance: Normal appearance.  HENT:     Head: Normocephalic.  Eyes:     Pupils: Pupils are equal, round, and reactive to light.  Cardiovascular:     Rate and Rhythm: Normal rate. Rhythm irregular.     Pulses: Normal pulses.     Heart sounds: Normal heart sounds.     Comments: Irregular with occasional early beats, likely PVCs. Pulmonary:     Effort: Pulmonary effort is normal.     Breath sounds: Normal breath sounds.  Abdominal:     General: Abdomen is flat.     Palpations: Abdomen is soft.  Musculoskeletal:     Right lower leg: No edema.     Left lower leg: No edema.  Skin:    General: Skin is warm and dry.     Capillary Refill: Capillary refill takes less than 2 seconds.  Neurological:     General: No focal deficit present.     Mental Status: He is alert and oriented to person, place, and time.  Psychiatric:        Mood and Affect: Mood normal.        Behavior: Behavior normal.        Thought Content: Thought content normal.        Judgment: Judgment normal.     ASSESSMENT AND PLAN:    Assessment & Plan Premature ventricular contractions with exertional dyspnea and reduced exercise tolerance Frequent PVCs with palpitations, shortness of breath, and weakness, exacerbated by exertion and stress. Recent ED visit showed frequent PVCs with no significant lab abnormalities. Symptoms reduced with self-managed metoprolol  tartrate. Plan to assess PVC burden and cardiac function to guide treatment.  - Order heart monitor to assess PVC burden and  pattern - Order echocardiogram to evaluate cardiac structure and function - Prescribe metoprolol  succinate 100 mg daily for continuous beta-blockade - Instruct to use additional 25 mg metoprolol  tartrate for breakthrough symptoms if needed  Dyspnea Patient with recent exertional dyspnea and fatigue. Symptoms could be anginal equivalent. Newly noted LBBB on ECG as well. - Order coronary CTA.  Atrial fibrillation on chronic anticoagulation Atrial fibrillation with rapid ventricular response managed with Eliquis . Anticoagulation management discussed with potential alternatives, including Watchman device, but no immediate changes planned.  - Continue Eliquis  for anticoagulation - Discuss potential Watchman device with electrophysiology if interested in future - Continue Cardizem . May need to consider dose adjustment with increased beta blockade.   Hypertension BP up today, has historically been well controlled. Patient asked to monitor at  home with medications changes.          Signed, Artist Pouch, PA-C

## 2024-07-07 NOTE — Progress Notes (Unsigned)
 Cardiology Office Note:   Date:  07/07/2024  ID:  OLYVER Rivas, DOB Sep 23, 1941, MRN [id number] PCP: Geofm Franky CROME., MD  [facility] Providers Cardiologist:  Lynwood Schilling, MD Sleep Medicine:  Wilbert Bihari, MD { Click to update primary MD,subspecialty MD or APP then REFRESH:1}   History of Present Illness:   Discussed the use of AI scribe software for clinical note transcription with the patient, who gave verbal consent to proceed.  History of Present Illness Vincent Rivas is an 83 year old male with atrial fibrillation and hypertension who presents with palpitations and shortness of breath.  He has a history of atrial fibrillation with rapid ventricular response, initially diagnosed in June 2023 after experiencing fatigue, shortness of breath, and an irregular pulse. An EKG confirmed atrial fibrillation with rapid ventricular response, and he was treated with IV diltiazem , converting to normal sinus rhythm. He was started on Eliquis  and oral diltiazem .  In November 2024, during a routine follow-up, he reported some symptoms of palpitations, but his Apple Watch showed no clear evidence of atrial fibrillation. No significant medication changes were made at that time. On June 18, 2024, he presented to the emergency department with palpitations, shortness of breath, and weakness for several days. He reported palpitations and shortness of breath with exertion, noting his heart felt like it was skipping every four beats. No syncope was noted, and he confirmed compliance with his medications. He reports that in the emergency department, he was told there were no significant lab abnormalities, but that frequent PVCs were seen on the monitor.  Today he describes ongoing PVCs. He reports increased stress and anxiety since his wife's passing earlier in the year. He self-managed his PVCs since visiting the ED with metoprolol  tartrate, taking 25 mg four times a day, adjusting the timing based on  stress or tension. This regimen has been effective in preventing PVCs, allowing him to go several mornings without them.  He still experiences shortness of breath with minimal exertion, such as walking to the mailbox, feels this has become worse in the last few weeks. He now limits his activities to avoid exertion, noting that he sometimes becomes clammy and out of breath with minimal activity. No chest pain, dizziness, or lightheadedness. He mentions clumsiness and balance issues, for which he uses a rollator at home. He has a history of neuropathy, which affects his balance. He notes swelling in his left upper arm, which he attributes to past cervical impingement, but denies recent trauma or overuse.   In clinic today patient is without focal symptoms.     Studies Reviewed:    EKG:     ED ECG reviewed. Newly noted LBBB with HR 69, sinus rhythm. Previous ECGs with similar axis, slightly more narrow QRS not meeting LBBB criteria.   Risk Assessment/Calculations:    CHA2DS2-VASc Score = 3  {Confirm score is correct.  If not, click here to update score.  REFRESH note.  :1} This indicates a 3.2% annual risk of stroke. The patient's score is based upon: CHF History: 0 HTN History: 1 Diabetes History: 0 Stroke History: 0 Vascular Disease History: 0 Age Score: 2 Gender Score: 0   {This patient has a significant risk of stroke if diagnosed with atrial fibrillation.  Please consider VKA or DOAC agent for anticoagulation if the bleeding risk is acceptable.   You can also use the SmartPhrase .[email or web address] for documentation.   :[id number]} HYPERTENSION CONTROL Vitals:   07/07/24 1025 07/07/24 1143  BP: ROLLEN)  140/62 (!) 140/64    The patient's blood pressure is elevated above target today. {Click here if intervention needs to be changed Refresh Note :1}  In order to address the patient's elevated BP: Blood pressure will be monitored at home to determine if medication changes need to be made.       STOP-Bang Score:     { Consider Dx Sleep Disordered Breathing or Sleep Apnea  ICD G47.33          :1}     Physical Exam:   VS:  BP (!) 140/64   Pulse 60   Ht 5' 11 (1.803 m)   Wt 233 lb (105.7 kg)   SpO2 97%   BMI 32.50 kg/m    Wt Readings from Last 3 Encounters:  07/07/24 233 lb (105.7 kg)  06/18/24 225 lb (102.1 kg)  10/11/23 235 lb 3.2 oz (106.7 kg)     Physical Exam Vitals reviewed.  Constitutional:      Appearance: Normal appearance.  HENT:     Head: Normocephalic.  Eyes:     Pupils: Pupils are equal, round, and reactive to light.  Cardiovascular:     Rate and Rhythm: Normal rate. Rhythm irregular.     Pulses: Normal pulses.     Heart sounds: Normal heart sounds.     Comments: Irregular with occasional early beats, likely PVCs. Pulmonary:     Effort: Pulmonary effort is normal.     Breath sounds: Normal breath sounds.  Abdominal:     General: Abdomen is flat.     Palpations: Abdomen is soft.  Musculoskeletal:     Right lower leg: No edema.     Left lower leg: No edema.  Skin:    General: Skin is warm and dry.     Capillary Refill: Capillary refill takes less than 2 seconds.  Neurological:     General: No focal deficit present.     Mental Status: He is alert and oriented to person, place, and time.  Psychiatric:        Mood and Affect: Mood normal.        Behavior: Behavior normal.        Thought Content: Thought content normal.        Judgment: Judgment normal.     ASSESSMENT AND PLAN:    Assessment & Plan Premature ventricular contractions with exertional dyspnea and reduced exercise tolerance Frequent PVCs with palpitations, shortness of breath, and weakness, exacerbated by exertion and stress. Recent ED visit showed frequent PVCs with no significant lab abnormalities. Symptoms reduced with self-managed metoprolol  tartrate. Plan to assess PVC burden and cardiac function to guide treatment.  - Order heart monitor to assess PVC burden and  pattern - Order echocardiogram to evaluate cardiac structure and function - Prescribe metoprolol  succinate 100 mg daily for continuous beta-blockade - Instruct to use additional 25 mg metoprolol  tartrate for breakthrough symptoms if needed  Dyspnea Patient with recent exertional dyspnea and fatigue. Symptoms could be anginal equivalent. Newly noted LBBB on ECG as well. - Order coronary CTA.  Atrial fibrillation on chronic anticoagulation Atrial fibrillation with rapid ventricular response managed with Eliquis . Anticoagulation management discussed with potential alternatives, including Watchman device, but no immediate changes planned.  - Continue Eliquis  for anticoagulation - Discuss potential Watchman device with electrophysiology if interested in future - Continue Cardizem . May need to consider dose adjustment with increased beta blockade.   Hypertension BP up today, has historically been well controlled. Patient asked to monitor at  home with medications changes.          Signed, Artist Pouch, PA-C

## 2024-07-07 NOTE — Progress Notes (Unsigned)
 ZIO serial # [id number] from office inventory applied to patient.  Dr. Lavona to read.

## 2024-07-09 ENCOUNTER — Other Ambulatory Visit (HOSPITAL_BASED_OUTPATIENT_CLINIC_OR_DEPARTMENT_OTHER): Payer: Self-pay

## 2024-07-16 ENCOUNTER — Ambulatory Visit (HOSPITAL_COMMUNITY)

## 2024-07-16 ENCOUNTER — Telehealth (HOSPITAL_COMMUNITY): Payer: Self-pay | Admitting: *Deleted

## 2024-07-16 ENCOUNTER — Other Ambulatory Visit (HOSPITAL_BASED_OUTPATIENT_CLINIC_OR_DEPARTMENT_OTHER): Payer: Self-pay

## 2024-07-16 ENCOUNTER — Other Ambulatory Visit (HOSPITAL_COMMUNITY)

## 2024-07-16 NOTE — Telephone Encounter (Signed)
 Attempted to call patient regarding upcoming cardiac CT appointment. Left message on voicemail with name and callback number Johney Frame RN Navigator Cardiac Imaging [facility] [phone] Office

## 2024-07-17 ENCOUNTER — Ambulatory Visit (HOSPITAL_COMMUNITY)
Admission: RE | Admit: 2024-07-17 | Discharge: 2024-07-17 | Disposition: A | Discharge status: Home / Self Care | Source: Ambulatory Visit | Attending: Cardiology | Admitting: Cardiology

## 2024-07-17 DIAGNOSIS — I2510 Atherosclerotic heart disease of native coronary artery without angina pectoris: Secondary | ICD-10-CM | POA: Diagnosis not present

## 2024-07-17 DIAGNOSIS — R0600 Dyspnea, unspecified: Secondary | ICD-10-CM | POA: Diagnosis present

## 2024-07-17 MED ORDER — IOHEXOL 350 MG/ML SOLN
100.0000 mL | Freq: Once | INTRAVENOUS | Status: AC | PRN
  Administered 2024-07-17: 100 mL via INTRAVENOUS

## 2024-07-17 MED ORDER — NITROGLYCERIN 0.4 MG SL SUBL
0.8000 mg | SUBLINGUAL_TABLET | Freq: Once | SUBLINGUAL | Status: AC
  Administered 2024-07-17: 0.8 mg via SUBLINGUAL

## 2024-07-18 ENCOUNTER — Other Ambulatory Visit: Payer: Self-pay | Admitting: Internal Medicine

## 2024-07-18 ENCOUNTER — Ambulatory Visit (HOSPITAL_COMMUNITY)
Admission: RE | Admit: 2024-07-18 | Discharge: 2024-07-18 | Disposition: A | Discharge status: Home / Self Care | Source: Ambulatory Visit | Attending: Internal Medicine | Admitting: Internal Medicine

## 2024-07-18 DIAGNOSIS — I2510 Atherosclerotic heart disease of native coronary artery without angina pectoris: Secondary | ICD-10-CM | POA: Insufficient documentation

## 2024-07-18 DIAGNOSIS — R931 Abnormal findings on diagnostic imaging of heart and coronary circulation: Secondary | ICD-10-CM | POA: Diagnosis present

## 2024-07-20 DIAGNOSIS — I493 Ventricular premature depolarization: Secondary | ICD-10-CM

## 2024-07-22 ENCOUNTER — Ambulatory Visit: Payer: Self-pay | Admitting: Cardiology

## 2024-07-22 ENCOUNTER — Telehealth: Payer: Self-pay | Admitting: Cardiology

## 2024-07-22 ENCOUNTER — Other Ambulatory Visit (HOSPITAL_BASED_OUTPATIENT_CLINIC_OR_DEPARTMENT_OTHER): Payer: Self-pay

## 2024-07-22 DIAGNOSIS — I25119 Atherosclerotic heart disease of native coronary artery with unspecified angina pectoris: Secondary | ICD-10-CM

## 2024-07-22 DIAGNOSIS — D6869 Other thrombophilia: Secondary | ICD-10-CM

## 2024-07-22 DIAGNOSIS — I10 Essential (primary) hypertension: Secondary | ICD-10-CM

## 2024-07-22 DIAGNOSIS — I351 Nonrheumatic aortic (valve) insufficiency: Secondary | ICD-10-CM

## 2024-07-22 DIAGNOSIS — I340 Nonrheumatic mitral (valve) insufficiency: Secondary | ICD-10-CM

## 2024-07-22 NOTE — Telephone Encounter (Signed)
   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 Patient will need LHC. Results were personally reviewed with patient by myself.  Patient will need CBC and BMET prior to [facility] [place]. Most recent ECG >30 days ago and will need to be updated. This should be able to be completed day of LHC prior to procedure. Please have him increase his Atorvastatin  to 80mg  daily.  Informed Consent   Shared Decision Making/Informed Consent The risks [stroke (1 in 1000), death (1 in 1000), kidney failure [usually temporary] (1 in 500), bleeding (1 in 200), allergic reaction [possibly serious] (1 in 200)], benefits (diagnostic support and management of coronary artery disease) and alternatives of a cardiac catheterization were discussed in detail with Mr. Pickup and he is willing to proceed.      Artist Pouch, PA-C

## 2024-07-23 ENCOUNTER — Other Ambulatory Visit (HOSPITAL_BASED_OUTPATIENT_CLINIC_OR_DEPARTMENT_OTHER): Payer: Self-pay

## 2024-07-23 MED ORDER — ATORVASTATIN CALCIUM 80 MG PO TABS
80.0000 mg | ORAL_TABLET | Freq: Every day | ORAL | 3 refills | Status: AC
  Filled 2024-07-23: qty 90, 90d supply, fill #0
  Filled 2024-10-14: qty 90, 90d supply, fill #1
  Filled 2025-01-12: qty 90, 90d supply, fill #2

## 2024-07-23 NOTE — Addendum Note (Signed)
 Addended by: Delwin Raczkowski N on: 07/23/2024 09:32 AM   Modules accepted: Orders

## 2024-07-23 NOTE — Telephone Encounter (Signed)
 Spoke with pt regarding scheduling Left Heart Cath. Pt is scheduled for 8/29 with Dr. Ladona. Pt aware of getting labs prior to cath. Labs ordered and released. EKG will be done at time of cath. Instructions sent via MyChart per pt's request. Atorvastatin  increased to 80 mg once daily. Pt aware. Pt verbalized understanding. All questions if any were answered.

## 2024-07-25 LAB — BASIC METABOLIC PANEL WITH GFR
BUN/Creatinine Ratio: 20 (ref 10–24)
BUN: 23 mg/dL (ref 8–27)
CO2: 20 mmol/L (ref 20–29)
Calcium: 9.7 mg/dL (ref 8.6–10.2)
Chloride: 102 mmol/L (ref 96–106)
Creatinine, Ser: 1.17 mg/dL (ref 0.76–1.27)
Glucose: 93 mg/dL (ref 70–99)
Potassium: 5 mmol/L (ref 3.5–5.2)
Sodium: 139 mmol/L (ref 134–144)
eGFR: 61 mL/min/1.73 (ref >59)

## 2024-07-25 LAB — CBC
Hematocrit: 45 % (ref 37.5–51.0)
Hemoglobin: 14.4 g/dL (ref 13.0–17.7)
MCH: 28 pg (ref 26.6–33.0)
MCHC: 32 g/dL (ref 31.5–35.7)
MCV: 88 fL (ref 79–97)
Platelets: 244 x10E3/uL (ref 150–450)
RBC: 5.14 x10E6/uL (ref 4.14–5.80)
RDW: 13.2 % (ref 11.6–15.4)
WBC: 7.1 x10E3/uL (ref 3.4–10.8)

## 2024-07-27 ENCOUNTER — Ambulatory Visit: Payer: Self-pay | Admitting: Cardiology

## 2024-07-28 ENCOUNTER — Telehealth: Payer: Self-pay | Admitting: *Deleted

## 2024-07-28 NOTE — Telephone Encounter (Signed)
 Cardiac Catheterization scheduled at [facility] for: Wednesday July 29, 2024 11:30 AM Arrival time [facility] Main Entrance A at: 9:30 AM  Diet: -Nothing to eat after midnight prior to procedure.  Hydration: -May drink clear liquids until leaving for hospital. Approved liquids: Water , clear tea, black coffee, fruit juices-non-citric and without pulp,Gatorade, plain Jello/popsicles.  Drink 16 oz. bottle of water  on the way to the hospital.  Medication instructions: -Hold:  Eliquis -none 07/27/24 until post procedure   Hydrochlorothiazide -AM of procedure  -Other usual morning medications can be taken including aspirin  81 mg.  Plan to go home the same day, you will only stay overnight if medically necessary.  You must have responsible adult to drive you home.  Someone must be with you the first 24 hours after you arrive home.  Reviewed procedure instructions with patient.

## 2024-07-29 ENCOUNTER — Encounter (HOSPITAL_COMMUNITY)
Admission: RE | Disposition: A | Discharge status: Home / Self Care | Payer: Self-pay | Source: Home / Self Care | Attending: Cardiology

## 2024-07-29 ENCOUNTER — Ambulatory Visit (HOSPITAL_COMMUNITY)
Admission: RE | Admit: 2024-07-29 | Discharge: 2024-07-29 | Disposition: A | Discharge status: Home / Self Care | Attending: Cardiology | Admitting: Cardiology

## 2024-07-29 ENCOUNTER — Other Ambulatory Visit (HOSPITAL_COMMUNITY): Payer: Self-pay

## 2024-07-29 ENCOUNTER — Other Ambulatory Visit: Payer: Self-pay

## 2024-07-29 DIAGNOSIS — Z79899 Other long term (current) drug therapy: Secondary | ICD-10-CM | POA: Diagnosis not present

## 2024-07-29 DIAGNOSIS — I10 Essential (primary) hypertension: Secondary | ICD-10-CM | POA: Insufficient documentation

## 2024-07-29 DIAGNOSIS — Z7901 Long term (current) use of anticoagulants: Secondary | ICD-10-CM | POA: Diagnosis not present

## 2024-07-29 DIAGNOSIS — I2510 Atherosclerotic heart disease of native coronary artery without angina pectoris: Secondary | ICD-10-CM | POA: Diagnosis present

## 2024-07-29 DIAGNOSIS — I4891 Unspecified atrial fibrillation: Secondary | ICD-10-CM | POA: Insufficient documentation

## 2024-07-29 DIAGNOSIS — I447 Left bundle-branch block, unspecified: Secondary | ICD-10-CM | POA: Diagnosis not present

## 2024-07-29 DIAGNOSIS — R0609 Other forms of dyspnea: Secondary | ICD-10-CM | POA: Diagnosis not present

## 2024-07-29 DIAGNOSIS — I2584 Coronary atherosclerosis due to calcified coronary lesion: Secondary | ICD-10-CM | POA: Insufficient documentation

## 2024-07-29 DIAGNOSIS — Z955 Presence of coronary angioplasty implant and graft: Secondary | ICD-10-CM

## 2024-07-29 DIAGNOSIS — I480 Paroxysmal atrial fibrillation: Secondary | ICD-10-CM

## 2024-07-29 HISTORY — PX: CORONARY ATHERECTOMY: CATH118238

## 2024-07-29 HISTORY — PX: LEFT HEART CATH AND CORONARY ANGIOGRAPHY: CATH118249

## 2024-07-29 HISTORY — PX: CORONARY STENT INTERVENTION: CATH118234

## 2024-07-29 LAB — POCT ACTIVATED CLOTTING TIME
Activated Clotting Time: 262 s
Activated Clotting Time: 279 s
Activated Clotting Time: 343 s

## 2024-07-29 SURGERY — LEFT HEART CATH AND CORONARY ANGIOGRAPHY
Anesthesia: LOCAL

## 2024-07-29 MED ORDER — SODIUM CHLORIDE 0.9 % IV SOLN: 250.0000 mL | INTRAVENOUS | Status: DC | PRN

## 2024-07-29 MED ORDER — ASPIRIN 81 MG PO CHEW: 81.0000 mg | CHEWABLE_TABLET | ORAL | Status: DC

## 2024-07-29 MED ORDER — VERAPAMIL HCL 2.5 MG/ML IV SOLN
INTRAVENOUS | Status: DC | PRN
  Administered 2024-07-29: 10 mL via INTRA_ARTERIAL

## 2024-07-29 MED ORDER — FENTANYL CITRATE (PF) 100 MCG/2ML IJ SOLN
INTRAMUSCULAR | Status: AC
  Filled 2024-07-29: qty 2

## 2024-07-29 MED ORDER — METOPROLOL SUCCINATE ER 100 MG PO TB24: 50.0000 mg | ORAL_TABLET | Freq: Every day | ORAL | Status: DC

## 2024-07-29 MED ORDER — ASPIRIN 81 MG PO TBEC
81.0000 mg | DELAYED_RELEASE_TABLET | Freq: Every day | ORAL | 0 refills | Status: AC
  Filled 2024-07-29 – 2024-11-09 (×4): qty 14, 14d supply, fill #0

## 2024-07-29 MED ORDER — NITROGLYCERIN 0.4 MG SL SUBL
0.4000 mg | SUBLINGUAL_TABLET | SUBLINGUAL | 3 refills | Status: AC | PRN
  Filled 2024-07-29: qty 25, 8d supply, fill #0
  Filled 2024-10-05: qty 25, 8d supply, fill #1
  Filled 2024-11-06: qty 25, 8d supply, fill #2

## 2024-07-29 MED ORDER — NITROGLYCERIN 1 MG/10 ML FOR IR/CATH LAB
INTRA_ARTERIAL | Status: DC | PRN
  Administered 2024-07-29 (×3): 200 ug via INTRACORONARY

## 2024-07-29 MED ORDER — SODIUM CHLORIDE 0.9% FLUSH
3.0000 mL | INTRAVENOUS | Status: DC | PRN
Start: 2024-07-29 — End: 2024-07-30

## 2024-07-29 MED ORDER — CLOPIDOGREL BISULFATE 75 MG PO TABS
75.0000 mg | ORAL_TABLET | Freq: Every day | ORAL | 3 refills | Status: AC
  Filled 2024-07-29: qty 90, 90d supply, fill #0
  Filled 2024-10-27: qty 90, 90d supply, fill #1

## 2024-07-29 MED ORDER — SODIUM CHLORIDE 0.9 % IV SOLN: INTRAVENOUS | Status: AC

## 2024-07-29 MED ORDER — FAMOTIDINE IN NACL 20-0.9 MG/50ML-% IV SOLN
INTRAVENOUS | Status: AC
  Filled 2024-07-29: qty 50

## 2024-07-29 MED ORDER — ASPIRIN 81 MG PO TBEC
81.0000 mg | DELAYED_RELEASE_TABLET | Freq: Every day | ORAL | 0 refills | Status: DC
  Filled 2024-07-29: qty 14, 14d supply, fill #0

## 2024-07-29 MED ORDER — FREE WATER: 500.0000 mL | Freq: Once | Status: DC

## 2024-07-29 MED ORDER — MIDAZOLAM HCL 2 MG/2ML IJ SOLN
INTRAMUSCULAR | Status: AC
Start: 2024-07-29 — End: 2024-07-29
  Filled 2024-07-29: qty 2

## 2024-07-29 MED ORDER — FENTANYL CITRATE (PF) 100 MCG/2ML IJ SOLN
INTRAMUSCULAR | Status: DC | PRN
  Administered 2024-07-29 (×2): 50 ug via INTRAVENOUS
  Administered 2024-07-29: 25 ug via INTRAVENOUS
  Administered 2024-07-29: 50 ug via INTRAVENOUS

## 2024-07-29 MED ORDER — ONDANSETRON HCL 4 MG/2ML IJ SOLN: 4.0000 mg | Freq: Four times a day (QID) | INTRAMUSCULAR | Status: DC | PRN

## 2024-07-29 MED ORDER — CLOPIDOGREL BISULFATE 300 MG PO TABS
ORAL_TABLET | ORAL | Status: AC
  Filled 2024-07-29: qty 2

## 2024-07-29 MED ORDER — NITROGLYCERIN 1 MG/10 ML FOR IR/CATH LAB
INTRA_ARTERIAL | Status: DC
Start: 2024-07-29 — End: 2024-07-29
  Filled 2024-07-29: qty 10

## 2024-07-29 MED ORDER — SODIUM CHLORIDE 0.9% FLUSH: 3.0000 mL | Freq: Two times a day (BID) | INTRAVENOUS | Status: DC

## 2024-07-29 MED ORDER — PANTOPRAZOLE SODIUM 40 MG PO TBEC
40.0000 mg | DELAYED_RELEASE_TABLET | Freq: Every day | ORAL | 1 refills | Status: DC
  Filled 2024-07-29: qty 30, 30d supply, fill #0
  Filled 2024-08-28: qty 30, 30d supply, fill #1

## 2024-07-29 MED ORDER — MIDAZOLAM HCL 2 MG/2ML IJ SOLN
INTRAMUSCULAR | Status: DC | PRN
  Administered 2024-07-29: 2 mg via INTRAVENOUS

## 2024-07-29 MED ORDER — SODIUM CHLORIDE 0.9% FLUSH: 3.0000 mL | INTRAVENOUS | Status: DC | PRN

## 2024-07-29 MED ORDER — VERAPAMIL HCL 2.5 MG/ML IV SOLN
INTRAVENOUS | Status: AC
Start: 2024-07-29 — End: 2024-07-29
  Filled 2024-07-29: qty 2

## 2024-07-29 MED ORDER — HEPARIN (PORCINE) IN NACL 1000-0.9 UT/500ML-% IV SOLN
INTRAVENOUS | Status: DC | PRN
  Administered 2024-07-29 (×3): 500 mL

## 2024-07-29 MED ORDER — ACETAMINOPHEN 325 MG PO TABS
650.0000 mg | ORAL_TABLET | ORAL | Status: DC | PRN
Start: 2024-07-29 — End: 2024-07-30

## 2024-07-29 MED ORDER — FAMOTIDINE IN NACL 20-0.9 MG/50ML-% IV SOLN
INTRAVENOUS | Status: DC | PRN
  Administered 2024-07-29: 20 mg via INTRAVENOUS

## 2024-07-29 MED ORDER — HEPARIN SODIUM (PORCINE) 1000 UNIT/ML IJ SOLN
INTRAMUSCULAR | Status: AC
  Filled 2024-07-29: qty 10

## 2024-07-29 MED ORDER — HYDRALAZINE HCL 20 MG/ML IJ SOLN: 5.0000 mg | INTRAMUSCULAR | Status: AC | PRN

## 2024-07-29 MED ORDER — CLOPIDOGREL BISULFATE 300 MG PO TABS
ORAL_TABLET | ORAL | Status: DC | PRN
  Administered 2024-07-29: 600 mg via ORAL

## 2024-07-29 MED ORDER — LIDOCAINE HCL (PF) 1 % IJ SOLN
INTRAMUSCULAR | Status: AC
  Filled 2024-07-29: qty 30

## 2024-07-29 MED ORDER — LABETALOL HCL 5 MG/ML IV SOLN: 10.0000 mg | INTRAVENOUS | Status: AC | PRN

## 2024-07-29 MED ORDER — VIPERSLIDE LUBRICANT OPTIME
TOPICAL | Status: DC | PRN
  Administered 2024-07-29: 20 mL via SURGICAL_CAVITY

## 2024-07-29 MED ORDER — LIDOCAINE HCL (PF) 1 % IJ SOLN
INTRAMUSCULAR | Status: DC | PRN
  Administered 2024-07-29: 2 mL via INTRADERMAL

## 2024-07-29 MED ORDER — HEPARIN SODIUM (PORCINE) 1000 UNIT/ML IJ SOLN
INTRAMUSCULAR | Status: DC | PRN
  Administered 2024-07-29: 3000 [IU] via INTRAVENOUS
  Administered 2024-07-29: 5000 [IU] via INTRAVENOUS
  Administered 2024-07-29: 3000 [IU] via INTRAVENOUS
  Administered 2024-07-29: 5000 [IU] via INTRAVENOUS

## 2024-07-29 MED ORDER — IOHEXOL 350 MG/ML SOLN
INTRAVENOUS | Status: DC | PRN
  Administered 2024-07-29: 260 mL

## 2024-07-29 SURGICAL SUPPLY — 21 items
BALLOON EUPHORA RX 3.0X25 (BALLOONS) IMPLANT
BALLOON SAPPHIRE 2.5X15 (BALLOONS) IMPLANT
BALLOON ~~LOC~~ EMERGE MR 3.25X12 (BALLOONS) IMPLANT
CATH 5FR JL3.5 JR4 ANG PIG MP (CATHETERS) IMPLANT
CATH INFINITI AMBI 5FR TG (CATHETERS) IMPLANT
CATH VISTA GUIDE 6FR XBLD 3.5 (CATHETERS) IMPLANT
CROWN DIAMONDBACK CLASSIC 1.25 (BURR) IMPLANT
DEVICE RAD COMP TR BAND LRG (VASCULAR PRODUCTS) IMPLANT
GLIDESHEATH SLEND A-KIT 6F 22G (SHEATH) IMPLANT
GUIDEWIRE INQWIRE 1.5J.035X260 (WIRE) IMPLANT
GUIDEWIRE VAS SION BLUE 190 (WIRE) IMPLANT
KIT ENCORE 26 ADVANTAGE (KITS) IMPLANT
KIT HEMO VALVE WATCHDOG (MISCELLANEOUS) IMPLANT
KIT SINGLE USE MANIFOLD (KITS) IMPLANT
LUBRICANT VIPERSLIDE CORONARY (MISCELLANEOUS) IMPLANT
PACK CARDIAC CATHETERIZATION (CUSTOM PROCEDURE TRAY) ×2 IMPLANT
SET ATX-X65L (MISCELLANEOUS) IMPLANT
STENT SYNERGY XD 3.0X48 (Permanent Stent) IMPLANT
STENT SYNERGY XD 3.50X16 (Permanent Stent) IMPLANT
WIRE RUNTHROUGH .014X180CM (WIRE) IMPLANT
WIRE VIPERWIRE COR FLEX .012 (WIRE) IMPLANT

## 2024-07-29 NOTE — Discharge Summary (Cosign Needed Addendum)
 Discharge Summary for Same Day PCI   Patient ID: Vincent Rivas MRN: [id number]; DOB: 17-May-1941  Admit date: 07/29/2024 Discharge date: 07/29/2024  Primary Care Provider: Geofm Franky CROME., MD  Primary Cardiologist: Lynwood Schilling, MD  Primary Electrophysiologist:  None   Discharge Diagnoses    Active Problems:   CAD (coronary artery disease)   Diagnostic Studies/Procedures    Cardiac Catheterization 07/29/24: Hemodynamic data: LVEDP 22 mmHg.  There is no pressure gradient across the aortic valve.   Angiographic data: LM: Large-caliber vessel.  Mildly calcified but no significant luminal obstruction. LAD: Ostial LAD with a 80 to 85% stenosis, calcified.  Mid LAD is severely diseased with heavy calcification with a high-grade diffuse tandem 80 to 95% stenosis.  There is a small to moderate-sized D1 that arises at the site of this mid LAD.  Apical LAD has a focal 95% stenosis.  Mid to distal LAD is mildly diseased. LCx: Very large caliber vessel, dominant vessel.  Gives origin to high OM1 which is small caliber but large distribution.  There is mild disease in the obtuse marginal 2-3.  PDA is diffusely diseased and again mildly calcified.   RCA: Nondominant by coronary CTA and severely diseased, could not engage.   Intervention data:  Successful, complex coronary intervention with orbital atherectomy of the ostial LAD at both low and high speed of 80K and 1 20K RPMs and orbital atherectomy of the mid LAD at 80K RPM using 1.25 mm classic diamondback crown followed by stenting of the mid LAD with a 3.0 x 48 mm Synergy XD DES and overlapping the proximal and ostial LAD with a 3.5 x 16 mm Synergy XD DES both at 16 atmospheric pressure, stenosis reduced from ostial 80-85% to 0% and mid 90% to 0%, TIMI-3 to TIMI-3 flow. Successful sidebranch D1 balloon angioplasty with a 2.5 x 15 mm balloon, no complication and maintained TIMI-3 flow, stenosis reduced from 90% to less than 10 to 15%.         Impression and recommendations: Patient will need DAPT with aspirin  and Plavix  for 6 months and probably consider Plavix  long-term in view of very long stents and diffuse disease in the LAD.  Patient had mild EKG changes but remained completely chest pain-free postprocedure with TIMI-3 flow with no sidebranch compromise. _____________   History of Present Illness     Vincent Rivas is a 83 y.o. male with PMH of atrial fibrillation and HTN who presented to the office 7/29 with shortness of breath. He has a history of atrial fibrillation with rapid ventricular response, initially diagnosed in June 2023 after experiencing fatigue, shortness of breath, and an irregular pulse. An EKG confirmed atrial fibrillation with rapid ventricular response, and he was treated with IV diltiazem , converting to normal sinus rhythm. He was started on Eliquis  and oral diltiazem .   In November 2024, during a routine follow-up, he reported some symptoms of palpitations, but his Apple Watch showed no clear evidence of atrial fibrillation. No significant medication changes were made at that time. On June 18, 2024, he presented to the emergency department with palpitations, shortness of breath, and weakness for several days. He reported palpitations and shortness of breath with exertion, noting his heart felt like it was skipping every four beats. No syncope was noted, and he confirmed compliance with his medications. He reported that in the emergency department, he was told there were no significant lab abnormalities, but that frequent PVCs were seen on the monitor.   He  described ongoing PVCs with increased stress and anxiety with his wife's passing. He was experiencing shortness of breath with minimal exertion, such as walking to the mailbox, felt this had become worse in the last few weeks. He limited his activities to avoid exertion, noting that he sometimes becomes clammy and out of breath with minimal activity. No chest  pain, dizziness, or lightheadedness. He mentioned clumsiness and balance issues, for which he uses a rollator at home. He has a history of neuropathy, which affects his balance. He noted swelling in his left upper arm, which he attributes to past cervical impingement, but denies recent trauma or overuse. He was started on metoprolol  succinate 100mg  daily, ordered for echo and cardiac monitor. Recommended for outpatient CCTa which showed significant calcium  with 3v CAD, with stenosis of LAD and RCA show high likelihood of hemodynamic significance by FFR analysis. Set up for outpatient cardiac cath.   Hospital Course     The patient underwent cardiac cath as noted above with complex orbital atherectomy with overlapping stents to o/pLAD, along with successful sidebranch D1 balloon angioplasty. Discussed with Dr. Ganji, as patient is on Eliquis , will plan for ASA 81mg  for 2 weeks and plavix /eliquis  for at least 6 month. The patient was seen by cardiac rehab while in short stay. There were no observed complications post cath. Radial cath site was re-evaluated prior to discharge and found to be stable without any complications. Instructions/precautions regarding cath site care were given prior to discharge.  Vincent Rivas was seen by Dr. Ladona and determined stable for discharge home. Follow up with our office has been arranged. Medications are listed below. Pertinent changes include addition of plavix , ASA, protonix . Reduced Toprol  from 100 to 50mg  with baseline bradycardia. Suspect his PVC burden may improve with revascularization.   _____________  Cath/PCI Registry Performance & Quality Measures: Aspirin  prescribed? - Yes ADP Receptor Inhibitor (Plavix /Clopidogrel , Brilinta/Ticagrelor or Effient/Prasugrel) prescribed (includes medically managed patients)? - Yes High Intensity Statin (Lipitor 40-80mg  or Crestor 20-40mg ) prescribed? - Yes For EF <40%, was ACEI/ARB prescribed? - Not Applicable (EF >/=  40%) For EF <40%, Aldosterone Antagonist (Spironolactone or Eplerenone) prescribed? - Not Applicable (EF >/= 40%) Cardiac Rehab Phase II ordered (Included Medically managed Patients)? - Yes  _____________   Discharge Vitals Blood pressure (!) 137/55, pulse (!) 58, temperature 97.6 F (36.4 C), temperature source Oral, resp. rate 18, height 5' 11 (1.803 m), weight 103.4 kg, SpO2 99%.  Filed Weights   07/29/24 0942  Weight: 103.4 kg    Last Labs & Radiologic Studies    CBC No results for input(s): WBC, NEUTROABS, HGB, HCT, MCV, PLT in the last 72 hours. Basic Metabolic Panel No results for input(s): NA, K, CL, CO2, GLUCOSE, BUN, CREATININE, CALCIUM , MG, PHOS in the last 72 hours. Liver Function Tests No results for input(s): AST, ALT, ALKPHOS, BILITOT, PROT, ALBUMIN  in the last 72 hours. No results for input(s): LIPASE, AMYLASE in the last 72 hours. High Sensitivity Troponin:   No results for input(s): TROPONINIHS in the last 720 hours.  BNP Invalid input(s): POCBNP D-Dimer No results for input(s): DDIMER in the last 72 hours. Hemoglobin A1C No results for input(s): HGBA1C in the last 72 hours. Fasting Lipid Panel No results for input(s): CHOL, HDL, LDLCALC, TRIG, CHOLHDL, LDLDIRECT in the last 72 hours. Thyroid Function Tests No results for input(s): TSH, T4TOTAL, T3FREE, THYROIDAB in the last 72 hours.  Invalid input(s): FREET3 _____________  CARDIAC CATHETERIZATION Addendum Date: 07/29/2024   1st Diag  lesion is 85% stenosed.   Balloon angioplasty was performed using a BALLOON SAPPHIRE 2.5X15.   Post intervention, there is a 10% residual stenosis. Cardiac Catheterization 07/29/24: Hemodynamic data: LVEDP 22 mmHg.  There is no pressure gradient across the aortic valve. Angiographic data: LM: Large-caliber vessel.  Mildly calcified but no significant luminal obstruction. LAD: Ostial LAD with a 80 to  85% stenosis, calcified.  Mid LAD is severely diseased with heavy calcification with a high-grade diffuse tandem 80 to 95% stenosis.  There is a small to moderate-sized D1 that arises at the site of this mid LAD.  Apical LAD has a focal 95% stenosis.  Mid to distal LAD is mildly diseased. LCx: Very large caliber vessel, dominant vessel.  Gives origin to high OM1 which is small caliber but large distribution.  There is mild disease in the obtuse marginal 2-3.  PDA is diffusely diseased and again mildly calcified. RCA: Nondominant by coronary CTA and severely diseased, could not engage. Intervention data: Successful, complex coronary intervention with orbital atherectomy of the ostial LAD at both low and high speed of 80K and 1 20K RPMs and orbital atherectomy of the mid LAD at 80K RPM using 1.25 mm classic diamondback crown followed by stenting of the mid LAD with a 3.0 x 48 mm Synergy XD DES and overlapping the proximal and ostial LAD with a 3.5 x 16 mm Synergy XD DES both at 16 atmospheric pressure, stenosis reduced from ostial 80-85% to 0% and mid 90% to 0%, TIMI-3 to TIMI-3 flow. Successful sidebranch D1 balloon angioplasty with a 2.5 x 15 mm balloon, no complication and maintained TIMI-3 flow, stenosis reduced from 90% to less than 10 to 15%. Impression and recommendations: Patient will need DAPT with aspirin  2 weeks and Plavix  for 6 months and start Eliquis  tomorrow.  Patient had mild EKG changes but remained completely chest pain-free postprocedure with TIMI-3 flow with no sidebranch compromise.  Result Date: 07/29/2024 Images from the original result were not included.   1st Diag lesion is 85% stenosed.   Balloon angioplasty was performed using a BALLOON SAPPHIRE 2.5X15.   Post intervention, there is a 10% residual stenosis. Cardiac Catheterization 07/29/24: Hemodynamic data: LVEDP 22 mmHg.  There is no pressure gradient across the aortic valve. Angiographic data: LM: Large-caliber vessel.  Mildly calcified  but no significant luminal obstruction. LAD: Ostial LAD with a 80 to 85% stenosis, calcified.  Mid LAD is severely diseased with heavy calcification with a high-grade diffuse tandem 80 to 95% stenosis.  There is a small to moderate-sized D1 that arises at the site of this mid LAD.  Apical LAD has a focal 95% stenosis.  Mid to distal LAD is mildly diseased. LCx: Very large caliber vessel, dominant vessel.  Gives origin to high OM1 which is small caliber but large distribution.  There is mild disease in the obtuse marginal 2-3.  PDA is diffusely diseased and again mildly calcified. RCA: Nondominant by coronary CTA and severely diseased, could not engage. Intervention data: Successful, complex coronary intervention with orbital atherectomy of the ostial LAD at both low and high speed of 80K and 1 20K RPMs and orbital atherectomy of the mid LAD at 80K RPM using 1.25 mm classic diamondback crown followed by stenting of the mid LAD with a 3.0 x 48 mm Synergy XD DES and overlapping the proximal and ostial LAD with a 3.5 x 16 mm Synergy XD DES both at 16 atmospheric pressure, stenosis reduced from ostial 80-85% to 0% and mid  90% to 0%, TIMI-3 to TIMI-3 flow. Successful sidebranch D1 balloon angioplasty with a 2.5 x 15 mm balloon, no complication and maintained TIMI-3 flow, stenosis reduced from 90% to less than 10 to 15%. Impression and recommendations: Patient will need DAPT with aspirin  and Plavix  for 6 months and probably consider Plavix  long-term in view of very long stents and diffuse disease in the LAD.  Patient had mild EKG changes but remained completely chest pain-free postprocedure with TIMI-3 flow with no sidebranch compromise.   CT CORONARY MORPH W/CTA COR W/SCORE W/CA W/CM &/OR WO/CM Addendum Date: 07/23/2024 ADDENDUM REPORT: 07/23/2024 19:29 EXAM: OVER-READ INTERPRETATION  CT CHEST The following report is an over-read performed by radiologist Dr. Fonda Mom [facility] on 07/23/2024. This  over-read does not include interpretation of cardiac or coronary anatomy or pathology. The coronary CTA interpretation by the cardiologist is attached. COMPARISON:  None. FINDINGS: Cardiovascular:  See findings discussed in the body of the report. Mediastinum/Nodes: No suspicious adenopathy identified. Imaged mediastinal structures are unremarkable. Lungs/Pleura: There is dependent basilar subsegmental atelectasis. No pneumonia or pulmonary edema. No pleural effusion or pneumothorax. Upper Abdomen: No acute abnormality. Musculoskeletal: No chest wall abnormality. No acute osseous findings. IMPRESSION: No acute extracardiac incidental findings. Electronically Signed   By: Fonda Field M.D.   On: 07/23/2024 19:29   Result Date: 07/23/2024 CLINICAL DATA:  DYSPNEA EXAM: Cardiac/Coronary CTA TECHNIQUE: A non-contrast, gated CT scan was obtained with axial slices of 2.5 mm through the heart for calcium  scoring. Calcium  scoring was performed using the Agatston method. A 120 kV prospective, gated, contrast cardiac CT scan was obtained. Gantry rotation speed was 230 msec and collimation was 0.63 mm. Two sublingual nitroglycerin  tablets (0.8 mg) were given. The 3D data set was reconstructed with motion correction for the best systolic or diastolic phase. Images were analyzed on a dedicated workstation using MPR, MIP, and VRT modes. The patient received 95 cc of contrast. FINDINGS: Image quality: Good Noise artifact is: Limited Coronary calcium  score is 3420, which places the patient in the 88th percentile for age and sex matched control. Coronary arteries: Normal coronary origins.  Left dominance. Right Coronary Artery: Small caliber nondominant RCA with severe stenosis 70-99% in the proximal RCA. Left Main Coronary Artery: Mild mixed plaque throughout the left main coronary artery, 25-49% stenosis. Left Anterior Descending Coronary Artery: The ostial LAD has moderate stenosis, 50-69%, progressing to severe plaque with  70-99% stenosis in the proximal LAD. The mid LAD just beyond the first septal perforator appears 99% stenosed with heavily calcified plaque, possible CTO. The vessel fills distally, suggesting either small channel of flow vs possibly filling distally by collaterals. Severe plaque in the proximal first diagonal artery, 70-99% stenosis. Severe stenosis distal LAD, 70-99%. Left Circumflex Artery: Moderate mixed plaque in the proximal LCx at the bifurcation of the first OM, 50-69% stenosis. Mild plaque in OM 1 and distal LCx/LPDA, 25-49% stenosis. Aorta: Normal size, 34 mm at the mid ascending aorta (level of the PA bifurcation) measured double oblique. Aortic Valve: Moderate calcifications. AV calcium  score 533 Other findings: Normal pulmonary vein drainage into the left atrium. Normal left atrial appendage without thrombus. Mild dilation pulmonary artery. Please see separate report from [facility] for non-cardiac findings. IMPRESSION: 1. Severe CAD in the proximal and mid LAD, proximal first diagonal, and proximal non-dominant RCA, 70-99% stenosis, CADRADS 4. CT FFR will be performed and reported separately. 2. Total plaque volume 1949 mm3 (calcified plaque 682 mm3; non-calcified plaque 1267 mm3), which is  93rd percentile for age- and sex-matched controls. 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 3. Coronary calcium  score is 3420, which places the patient in the 88th percentile for age and sex matched control. 4. Normal coronary origins with left dominance. 5. Aortic Valve: Moderate calcifications. AV calcium  score 533 RECOMMENDATIONS: CAD-RADS 4 Severe stenosis. (70-99% or > 50% left main). Cardiac catheterization or CT FFR is recommended. Consider symptom-guided anti-ischemic pharmacotherapy as well as risk factor modification per guideline directed care. Electronically Signed: By: Soyla Merck M.D. On: 07/19/2024 06:32   LONG TERM MONITOR (3-14 DAYS) Result Date: 07/20/2024 Predominant rhythm was normal sinus. Infrequent  supraventricular runs with the longest lasting 5 beats Rare ventricular ectopy No sustained arrhythmias.   CT CORONARY FRACTIONAL FLOW RESERVE FLUID ANALYSIS Result Date: 07/19/2024 EXAM: CT FFR analysis was performed on the original cardiac CTA dataset. Diagrammatic representation of the CT FFR analysis is provided in a separate PDF document in PACS. This dictation was created using the PDF document and an interactive 3D model of the results. The 3D model is not available in the EMR/PACS. INTERPRETATION: CT FFR provides simultaneous calculation of pressure and flow across the entire coronary tree. For clinical decision making, CT FFR values should be obtained 1-2 cm distal to the lower border of each stenosis measured. Coronary CTA-related artifacts may impair the diagnostic accuracy of the original cardiac CTA and FFR CT results. *Due to the fact that CT FFR represents a mathematically-derived analysis, it is recommended that the results be interpreted as follows: 1. CT FFR >0.80: Low likelihood of hemodynamic significance. 2. CT FFR 0.76-0.80: Borderline likelihood of hemodynamic significance. 3. CT FFR =< 0.75: High likelihood of hemodynamic significance. *Coronary CT Angiography-derived Fractional Flow Reserve Testing in Patients with Stable Coronary Artery Disease: Recommendations on Interpretation and Reporting. Radiology: Cardiothoracic Imaging. 2019;1(5):e190050 FINDINGS: 1. Left Main: Low likelihood of hemodynamic significance. 2. LAD: High likelihood of hemodynamic significance. FFR at mid LAD lesion 0.64. Hemodynamic significance not demonstrated in the ostial-proximal LAD by CT FFR, nor in the first diagonal, D1 FFR 0.87. 3. LCX: Low likelihood of hemodynamic significance. 4. RCA: High likelihood of hemodynamic significance. FFR in proximal RCA is 0.54. IMPRESSION: 1. CT FFR analysis showed hemodynamically significant stenosis in the mid LAD and proximal nondominant RCA. RECOMMENDATIONS: Guideline  directed medical therapy for secondary prevention of CAD. Consider cardiac catheterization if clinically indicated. Electronically Signed   By: Soyla Merck M.D.   On: 07/19/2024 06:41    Disposition   Pt is being discharged home today in good condition.  Follow-up Plans & Appointments     Discharge Instructions     AMB Referral to Cardiac Rehabilitation - Phase II   Complete by: As directed    Diagnosis: Coronary Stents   After initial evaluation and assessments completed: Virtual Based Care may be provided alone or in conjunction with Phase 2 Cardiac Rehab based on patient barriers.: Yes   Intensive Cardiac Rehabilitation (ICR) [facility] location only OR Traditional Cardiac Rehabilitation (TCR) *If criteria for ICR are not met will enroll in TCR [facility] only): Yes        Discharge Medications   Allergies as of 07/29/2024       Reactions   Oxycodone  Other (See Comments)   Pt prefers Hydrocodone  in the future   Pollen Extract Cough, Itching   Neurontin  [gabapentin ] Other (See Comments)   Extreme fatigue         Medication List     STOP taking these medications    omeprazole 40  MG capsule Commonly known as: PRILOSEC       TAKE these medications    acetaminophen  650 MG CR tablet Commonly known as: TYLENOL  Take 1,300 mg by mouth 2 (two) times daily as needed (neck/shoulder pain).   aspirin  EC 81 MG tablet Take 1 tablet (81 mg total) by mouth daily for 14 days. Swallow whole.   atorvastatin  80 MG tablet Commonly known as: LIPITOR Take 1 tablet (80 mg total) by mouth daily.   clopidogrel  75 MG tablet Commonly known as: Plavix  Take 1 tablet (75 mg total) by mouth daily.   diltiazem  120 MG 24 hr capsule Commonly known as: DILACOR XR  Take 1 capsule (120 mg total) by mouth daily.   DULoxetine  60 MG capsule Commonly known as: CYMBALTA  Take 60 mg by mouth daily.   Eliquis  5 MG Tabs tablet Generic drug: apixaban  Take 1 tablet (5 mg total) by mouth 2 (two)  times daily.   fexofenadine 180 MG tablet Commonly known as: ALLEGRA Take 180 mg by mouth daily.   fluticasone 50 MCG/ACT nasal spray Commonly known as: FLONASE Place 1 spray into both nostrils daily as needed for allergies or rhinitis.   hydrochlorothiazide  25 MG tablet Commonly known as: HYDRODIURIL  Take 25 mg by mouth daily.   ketoconazole  2 % shampoo Commonly known as: NIZORAL  Apply to scalp topically 2 (two) times a week.   lisinopril  40 MG tablet Commonly known as: ZESTRIL  Take 1 tablet (40 mg total) by mouth daily.   metoprolol  succinate 100 MG 24 hr tablet Commonly known as: TOPROL -XL Take 0.5 tablets (50 mg total) by mouth daily. Take with or immediately following a meal. What changed: how much to take   metoprolol  tartrate 25 MG tablet Commonly known as: LOPRESSOR  Take 25 mg by mouth as needed (for break through palpitations).   nitroGLYCERIN  0.4 MG SL tablet Commonly known as: Nitrostat  Place 1 tablet (0.4 mg total) under the tongue every 5 (five) minutes as needed.   pantoprazole  40 MG tablet Commonly known as: Protonix  Take 1 tablet (40 mg total) by mouth daily.   zolpidem  5 MG tablet Commonly known as: AMBIEN  Take 1 tablet (5 mg total) by mouth at bedtime as needed for sleep. Max daily dose: 5 mg.           Allergies Allergies  Allergen Reactions   Oxycodone  Other (See Comments)    Pt prefers Hydrocodone  in the future   Pollen Extract Cough and Itching   Neurontin  [Gabapentin ] Other (See Comments)    Extreme fatigue     Outstanding Labs/Studies   N/a   Duration of Discharge Encounter   Greater than 30 minutes including physician time.  Signed, Manuelita Rummer, NP 07/29/2024, 2:51 PM

## 2024-07-29 NOTE — Discharge Instructions (Addendum)

## 2024-07-29 NOTE — Interval H&P Note (Signed)
 History and Physical Interval Note:  07/29/2024 10:18 AM  Vincent Rivas  has presented today for surgery, with the diagnosis of cad.  The various methods of treatment have been discussed with the patient and family. After consideration of risks, benefits and other options for treatment, the patient has consented to  Procedure(s): LEFT HEART CATH AND CORONARY ANGIOGRAPHY (N/A) and possible coronary angioplasty as a surgical intervention.    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  Patient is on 2 antianginal agents and on statins.  The patient's history has been reviewed, patient examined, no change in status, stable for surgery.  I have reviewed the patient's chart and labs.  Questions were answered to the patient's satisfaction.     Gordy Bergamo

## 2024-07-29 NOTE — Progress Notes (Signed)
 Discussed with pt stents, Plavix  importance, restrictions, exercise, NTG and CRPII. Pt receptive. Will refer to [facility] CRPII.  [id number] Aliene Aris BS, ACSM-CEP 07/29/2024 3:01 PM

## 2024-07-30 ENCOUNTER — Encounter (HOSPITAL_COMMUNITY): Payer: Self-pay | Admitting: Cardiology

## 2024-07-30 DIAGNOSIS — I480 Paroxysmal atrial fibrillation: Secondary | ICD-10-CM

## 2024-07-30 DIAGNOSIS — Z955 Presence of coronary angioplasty implant and graft: Secondary | ICD-10-CM

## 2024-08-02 ENCOUNTER — Encounter: Payer: Self-pay | Admitting: Cardiology

## 2024-08-03 ENCOUNTER — Telehealth (HOSPITAL_COMMUNITY): Payer: Self-pay

## 2024-08-03 NOTE — Telephone Encounter (Signed)
 Per phase I cardiac rehab send referral to [facility].

## 2024-08-13 ENCOUNTER — Ambulatory Visit (HOSPITAL_COMMUNITY)
Admission: RE | Admit: 2024-08-13 | Discharge: 2024-08-13 | Disposition: A | Discharge status: Home / Self Care | Source: Ambulatory Visit | Attending: Cardiology | Admitting: Cardiology

## 2024-08-13 DIAGNOSIS — G4730 Sleep apnea, unspecified: Secondary | ICD-10-CM | POA: Insufficient documentation

## 2024-08-13 DIAGNOSIS — R0602 Shortness of breath: Secondary | ICD-10-CM | POA: Diagnosis not present

## 2024-08-13 DIAGNOSIS — Z87891 Personal history of nicotine dependence: Secondary | ICD-10-CM | POA: Insufficient documentation

## 2024-08-13 DIAGNOSIS — I447 Left bundle-branch block, unspecified: Secondary | ICD-10-CM | POA: Diagnosis not present

## 2024-08-13 DIAGNOSIS — I4891 Unspecified atrial fibrillation: Secondary | ICD-10-CM | POA: Insufficient documentation

## 2024-08-13 DIAGNOSIS — I080 Rheumatic disorders of both mitral and aortic valves: Secondary | ICD-10-CM | POA: Diagnosis not present

## 2024-08-13 DIAGNOSIS — I493 Ventricular premature depolarization: Secondary | ICD-10-CM | POA: Insufficient documentation

## 2024-08-13 DIAGNOSIS — I480 Paroxysmal atrial fibrillation: Secondary | ICD-10-CM | POA: Diagnosis not present

## 2024-08-13 DIAGNOSIS — R002 Palpitations: Secondary | ICD-10-CM | POA: Diagnosis not present

## 2024-08-13 DIAGNOSIS — Z8249 Family history of ischemic heart disease and other diseases of the circulatory system: Secondary | ICD-10-CM | POA: Diagnosis not present

## 2024-08-13 DIAGNOSIS — I10 Essential (primary) hypertension: Secondary | ICD-10-CM | POA: Insufficient documentation

## 2024-08-13 LAB — ECHOCARDIOGRAM COMPLETE
AR max vel: 2.29 cm2
AV Area VTI: 2.22 cm2
AV Area mean vel: 2.18 cm2
AV Mean grad: 9 mmHg
AV Peak grad: 15.7 mmHg
Ao pk vel: 1.98 m/s
Area-P 1/2: 3.75 cm2
MV M vel: 5.69 m/s
MV Peak grad: 129.5 mmHg
P 1/2 time: 459 ms
S' Lateral: 2.9 cm

## 2024-08-17 ENCOUNTER — Other Ambulatory Visit (HOSPITAL_BASED_OUTPATIENT_CLINIC_OR_DEPARTMENT_OTHER): Payer: Self-pay

## 2024-08-17 ENCOUNTER — Other Ambulatory Visit: Payer: Self-pay

## 2024-08-17 ENCOUNTER — Encounter: Payer: Self-pay | Admitting: Cardiology

## 2024-08-18 ENCOUNTER — Other Ambulatory Visit (HOSPITAL_BASED_OUTPATIENT_CLINIC_OR_DEPARTMENT_OTHER): Payer: Self-pay

## 2024-08-18 MED ORDER — DULOXETINE HCL 60 MG PO CPEP
60.0000 mg | ORAL_CAPSULE | Freq: Every day | ORAL | 2 refills | Status: AC
  Filled 2024-08-18: qty 90, 90d supply, fill #0
  Filled 2024-11-12: qty 90, 90d supply, fill #1

## 2024-08-20 ENCOUNTER — Ambulatory Visit: Attending: Cardiovascular Disease | Admitting: Cardiovascular Disease

## 2024-08-20 ENCOUNTER — Encounter: Payer: Self-pay | Admitting: Cardiovascular Disease

## 2024-08-20 VITALS — BP 146/52 | HR 61 | Ht 71.0 in | Wt 235.4 lb

## 2024-08-20 DIAGNOSIS — I351 Nonrheumatic aortic (valve) insufficiency: Secondary | ICD-10-CM

## 2024-08-20 NOTE — Patient Instructions (Signed)
 Medication Instructions:  .Your physician recommends that you continue on your current medications as directed. Please refer to the Current Medication list given to you today.  *If you need a refill on your cardiac medications before your next appointment, please call your pharmacy*  Lab Work: none If you have labs (blood work) drawn today and your tests are completely normal, you will receive your results only by: MyChart Message (if you have MyChart) OR A paper copy in the mail If you have any lab test that is abnormal or we need to change your treatment, we will call you to review the results.  Testing/Procedures: none  Follow-Up: At [facility], you and your health needs are our priority.  As part of our continuing mission to provide you with exceptional heart care, our providers are all part of one team.  This team includes your primary Cardiologist (physician) and Advanced Practice Providers or APPs (Physician Assistants and Nurse Practitioners) who all work together to provide you with the care you need, when you need it.  Your next appointment:   To be determined  Provider:   Dr Verlin   We recommend signing up for the patient portal called MyChart.  Sign up information is provided on this After Visit Summary.  MyChart is used to connect with patients for Virtual Visits (Telemedicine).  Patients are able to view lab/test results, encounter notes, upcoming appointments, etc.  Non-urgent messages can be sent to your provider as well.   To learn more about what you can do with MyChart, go to [email or web address].   Other Instructions Follow up with Dr Lavona as planned

## 2024-08-20 NOTE — Progress Notes (Signed)
 Structural Heart Clinic Consult Note  Chief Complaint  Patient presents with   New Patient (Initial Visit)    Aortic valve insufficiency   History of Present Illness: 83 yo male with history of HTN, paroxysmal atrial fibrillation, PVCs, CAD and aortic valve insufficiency who is here today as a new consult, referred by Dr. Lavona, to discuss his aortic valve insufficiency. He was found to have multi-vessel CAD by Cardiac CT in August 2025. AV calcium  score of 533. Cardiac cath 07/29/24 with severe, calcific disease in the proximal and mid LAD treated with atherectomy and stenting. At the time of his cath, the treating team was not aware of his severe aortic valve insufficiency. Echo performed two weeks after his cardiac cath on 08/13/24 showed LVEF=55-60%. Normal RV function. Moderate mitral regurgitation. Severe aortic valve insufficiency. He is on Plavix  following recent coronary stent and is on Eliquis  for long term anti-coagulation given his atrial fibrillation.   He tells me today that he has seen improvement in his dyspnea with exertion since he had the coronary stent placed. He can now walk for 10 minutes before having dyspnea. He has no chest pain, dizziness, near syncope, syncope or LE edema. He is a widow and lives in [place], [place]. He is retired Arts development officer.   Primary Care Physician: Geofm Franky CROME., MD Primary Cardiologist: Hochrein Referring Cardiologist: Lavona  Past Medical History:  Diagnosis Date   Aortic insufficiency    Arthritis    Atrial fibrillation (HCC)    CAD (coronary artery disease)    Chronic prostatitis    also with BPH   GERD (gastroesophageal reflux disease)    Hypertension    Sleep apnea    does not wear CPAP    Past Surgical History:  Procedure Laterality Date   CERVICAL SPINE SURGERY     2006   CORONARY ATHERECTOMY N/A 07/29/2024   Procedure: CORONARY ATHERECTOMY;  Surgeon: Ladona Heinz, MD;  Location[facility] INVASIVE CV LAB;  Service:  Cardiovascular;  Laterality: N/A;   CORONARY STENT INTERVENTION N/A 07/29/2024   Procedure: CORONARY STENT INTERVENTION;  Surgeon: Ladona Heinz, MD;  Location: [facility] INVASIVE CV LAB;  Service: Cardiovascular;  Laterality: N/A;   JOINT REPLACEMENT     2007 RT HIP REPLACEMENT,  [id number]    JOINT REPLACEMENT Left    HIP   LEFT HEART CATH AND CORONARY ANGIOGRAPHY N/A 07/29/2024   Procedure: LEFT HEART CATH AND CORONARY ANGIOGRAPHY;  Surgeon: Ladona Heinz, MD;  Location: [facility] INVASIVE CV LAB;  Service: Cardiovascular;  Laterality: N/A;   LUMBAR SPINE SURGERY     ROTATOR CUFF REPAIR Bilateral 04/05/2022   also in 2020    Current Outpatient Medications  Medication Sig Dispense Refill   acetaminophen  (TYLENOL ) 650 MG CR tablet Take 1,300 mg by mouth 2 (two) times daily as needed (neck/shoulder pain).     apixaban  (ELIQUIS ) 5 MG TABS tablet Take 1 tablet (5 mg total) by mouth 2 (two) times daily. 180 tablet 2   atorvastatin  (LIPITOR) 80 MG tablet Take 1 tablet (80 mg total) by mouth daily. 90 tablet 3   clopidogrel  (PLAVIX ) 75 MG tablet Take 1 tablet (75 mg total) by mouth daily. 90 tablet 3   diltiazem  (DILACOR XR ) 120 MG 24 hr capsule Take 1 capsule (120 mg total) by mouth daily. 90 capsule 4   DULoxetine  (CYMBALTA ) 60 MG capsule Take 1 capsule (60 mg total) by mouth daily. 90 capsule 2   fexofenadine (ALLEGRA) 180 MG tablet Take 180  mg by mouth daily.     fluticasone (FLONASE) 50 MCG/ACT nasal spray Place 1 spray into both nostrils daily as needed for allergies or rhinitis.     hydrochlorothiazide  (HYDRODIURIL ) 25 MG tablet Take 25 mg by mouth daily.     lisinopril  (ZESTRIL ) 40 MG tablet Take 1 tablet (40 mg total) by mouth daily. 90 tablet 4   metoprolol  succinate (TOPROL -XL) 100 MG 24 hr tablet Take 0.5 tablets (50 mg total) by mouth daily. Take with or immediately following a meal.     metoprolol  tartrate (LOPRESSOR ) 25 MG tablet Take 25 mg by mouth as needed (for break through palpitations).      nitroGLYCERIN  (NITROSTAT ) 0.4 MG SL tablet Place 1 tablet (0.4 mg total) under the tongue every 5 (five) minutes as needed. 25 tablet 3   pantoprazole  (PROTONIX ) 40 MG tablet Take 1 tablet (40 mg total) by mouth daily. 30 tablet 1   zolpidem  (AMBIEN ) 5 MG tablet Take 1 tablet (5 mg total) by mouth at bedtime as needed for sleep. Max daily dose: 5 mg. 30 tablet 5   No current facility-administered medications for this visit.    Allergies  Allergen Reactions   Oxycodone  Other (See Comments)    Pt prefers Hydrocodone  in the future   Pollen Extract Cough and Itching   Neurontin  [Gabapentin ] Other (See Comments)    Extreme fatigue     Social History   Socioeconomic History   Marital status: Widowed    Spouse name: Not on file   Number of children: 2   Years of education: Not on file   Highest education level: Not on file  Occupational History   Occupation: Retired-Textiles and Optometrist  Tobacco Use   Smoking status: Former    Current packs/day: 1.50    Average packs/day: 1.5 packs/day for 10.0 years (15.0 ttl pk-yrs)    Types: Cigarettes   Smokeless tobacco: Never   Tobacco comments:    Quit in 1970  Vaping Use   Vaping status: Never Used  Substance and Sexual Activity   Alcohol use: Not Currently    Alcohol/week: 7.0 standard drinks of alcohol    Types: 7 Glasses of wine per week    Comment: WINE  4 OZ + tonic water  07/24/22   Drug use: No   Sexual activity: Not Currently  Other Topics Concern   Not on file  Social History Narrative   Smoked in his twenties.  Retired Engineer, maintenance for [employer].  Two children.  Lives with wife.     Social Drivers of Health   Financial Resource Strain: Patient Declined (12/31/2023)   Received from [facility]   Overall Financial Resource Strain (CARDIA)    Difficulty of Paying Living Expenses: Patient declined  Food Insecurity: Patient Declined (12/31/2023)   Received from [facility]   Hunger Vital Sign    Within the past  12 months, you worried that your food would run out before you got the money to buy more.: Patient declined    Within the past 12 months, the food you bought just didn't last and you didn't have money to get more.: Patient declined  Transportation Needs: Patient Declined (12/31/2023)   Received from [facility] - Transportation    Lack of Transportation (Medical): Patient declined    Lack of Transportation (Non-Medical): Patient declined  Physical Activity: Unknown (11/14/2023)   Received from [facility]   Exercise Vital Sign    On average, how many days  per week do you engage in moderate to strenuous exercise (like a brisk walk)?: Patient declined    Minutes of Exercise per Session: Not on file  Stress: Patient Declined (11/14/2023)   Received from [facility] - Occupational Stress Questionnaire    Feeling of Stress : Patient declined  Social Connections: Moderately Integrated (11/14/2023)   Received from [facility]   Social Network    How would you rate your social network (family, work, friends)?: Adequate participation with social networks  Intimate Partner Violence: Patient Declined (11/14/2023)   Received from [facility]   HITS    Over the last 12 months how often did your partner physically hurt you?: Patient declined    Over the last 12 months how often did your partner insult you or talk down to you?: Patient declined    Over the last 12 months how often did your partner threaten you with physical harm?: Patient declined    Over the last 12 months how often did your partner scream or curse at you?: Patient declined    Family History  Problem Relation Age of Onset   Diabetes Mother    CAD Mother 29   Kidney failure Father        Complications of a severe burn    Review of Systems:  As stated in the HPI and otherwise negative.   BP (!) 146/52   Pulse 61   Ht 5' 11 (1.803 m)   Wt 235 lb 6.4 oz (106.8 kg)    SpO2 98%   BMI 32.83 kg/m   Physical Examination: General: Well developed, well nourished, NAD  HEENT: OP clear, mucus membranes moist  SKIN: warm, dry. No rashes. Neuro: No focal deficits  Musculoskeletal: Muscle strength 5/5 all ext  Psychiatric: Mood and affect normal  Neck: No JVD, no carotid bruits, no thyromegaly, no lymphadenopathy.  Lungs:Clear bilaterally, no wheezes, rhonci, crackles Cardiovascular: Regular rate and rhythm. Diastolic murmur over RUSB. Systolic murmur over LLSB  Abdomen:Soft. Bowel sounds present. Non-tender.  Extremities:  No lower extremity edema. Pulses are 2 + in the bilateral DP/PT.  EKG:  EKG is not ordered today. The ekg ordered today demonstrates   Echo 08/13/24:  1. Left ventricular ejection fraction, by estimation, is 55 to 60%. Left  ventricular ejection fraction by 3D volume is 68 %. The left ventricle has  normal function. Left ventricular endocardial border not optimally defined  to evaluate regional wall  motion. Left ventricular diastolic parameters are consistent with Grade I  diastolic dysfunction (impaired relaxation).   2. Right ventricular systolic function is normal. The right ventricular  size is normal.   3. Left atrial size was moderately dilated.   4. The mitral valve is normal in structure. Moderate mitral valve  regurgitation. No evidence of mitral stenosis.   5. The aortic valve is calcified with poor leaflet coaptation. There is  severe aortic regurgitation with a pressure half time of . Consider  TEE for better visualization.   6. The inferior vena cava is normal in size with greater than 50%  respiratory variability, suggesting right atrial pressure of 3 mmHg.   FINDINGS   Left Ventricle: Left ventricular ejection fraction, by estimation, is 55  to 60%. Left ventricular ejection fraction by 3D volume is 68 %. The left  ventricle has normal function. Left ventricular endocardial border not  optimally defined to  evaluate  regional wall motion. The left ventricular internal cavity size  was normal  in size. There is no left ventricular hypertrophy. Left ventricular  diastolic parameters are consistent with Grade I diastolic dysfunction  (impaired relaxation).   Right Ventricle: The right ventricular size is normal. No increase in  right ventricular wall thickness. Right ventricular systolic function is  normal.   Left Atrium: Left atrial size was moderately dilated.   Right Atrium: Right atrial size was normal in size.   Pericardium: There is no evidence of pericardial effusion.   Mitral Valve: The mitral valve is normal in structure. Moderate mitral  valve regurgitation. No evidence of mitral valve stenosis.   Tricuspid Valve: The tricuspid valve is normal in structure. Tricuspid  valve regurgitation is mild . No evidence of tricuspid stenosis.   Aortic Valve: The aortic valve is calcified. Aortic valve regurgitation is  severe. Aortic regurgitation PHT measures 459 msec. No aortic stenosis is  present. Aortic valve mean gradient measures 9.0 mmHg. Aortic valve peak  gradient measures 15.7 mmHg.  Aortic valve area, by VTI measures 2.22 cm.   Pulmonic Valve: The pulmonic valve was normal in structure. Pulmonic valve  regurgitation is not visualized. No evidence of pulmonic stenosis.   Aorta: The aortic root is normal in size and structure.   Venous: The inferior vena cava is normal in size with greater than 50%  respiratory variability, suggesting right atrial pressure of 3 mmHg.   IAS/Shunts: No atrial level shunt detected by color flow Doppler.   Additional Comments: 3D was performed not requiring image post processing  on an independent workstation and was normal.     LEFT VENTRICLE  PLAX 2D  LVIDd:         5.20 cm         Diastology  LVIDs:         2.90 cm         LV e' medial:    6.53 cm/s  LV PW:         1.10 cm         LV E/e' medial:  12.5  LV IVS:        1.20 cm          LV e' lateral:   7.24 cm/s  LVOT diam:     2.60 cm         LV E/e' lateral: 11.3  LV SV:         109  LV SV Index:   49  LVOT Area:     5.31 cm        3D Volume EF                                 LV 3D EF:    Left                                              ventricul                                              ar  ejection                                              fraction                                              by 3D                                              volume is                                              68 %.                                   3D Volume EF:                                 3D EF:        68 %                                 LV EDV:       178 ml                                 LV ESV:       57 ml                                 LV SV:        121 ml   RIGHT VENTRICLE  RV Basal diam:  4.10 cm  RV Mid diam:    2.90 cm  RV S prime:     16.00 cm/s  TAPSE (M-mode): 3.4 cm  RVSP:           40.9 mmHg   LEFT ATRIUM              Index        RIGHT ATRIUM           Index  LA diam:        4.70 cm  2.11 cm/m   RA Pressure: 3.00 mmHg  LA Vol (A2C):   116.0 ml 52.03 ml/m  RA Area:     18.70 cm  LA Vol (A4C):   78.8 ml  35.35 ml/m  RA Volume:   56.50 ml  25.34 ml/m  LA Biplane Vol: 97.9 ml  43.91 ml/m   AORTIC VALVE  AV Area (Vmax):    2.29 cm  AV Area (Vmean):   2.18 cm  AV Area (VTI):     2.22 cm  AV Vmax:           198.00 cm/s  AV  Vmean:          141.000 cm/s  AV VTI:            0.492 m  AV Peak Grad:      15.7 mmHg  AV Mean Grad:      9.0 mmHg  LVOT Vmax:         85.25 cm/s  LVOT Vmean:        58.000 cm/s  LVOT VTI:          0.206 m  LVOT/AV VTI ratio: 0.42  AI PHT:            459 msec    AORTA  Ao Root diam: 3.20 cm  Ao Asc diam:  3.60 cm   MITRAL VALVE               TRICUSPID VALVE  MV Area (PHT)  cm         TR Peak grad:   37.9 mmHg  MV Decel Time: 202 msec    TR Vmax:        308.00  cm/s  MR Peak grad: 129.5 mmHg   Estimated RAP:  3.00 mmHg  MR Mean grad: 86.0 mmHg    RVSP:           40.9 mmHg  MR Vmax:      569.00 cm/s  MR Vmean:     436.0 cm/s   SHUNTS  MV E velocity: 81.70 cm/s  Systemic VTI:  0.21 m  MV A velocity: 92.73 cm/s  Systemic Diam: 2.60 cm  MV E/A ratio:  0.88   Recent Labs: 06/18/2024: Magnesium  2.0 07/24/2024: BUN 23; Creatinine, Ser 1.17; Hemoglobin 14.4; Platelets 244; Potassium 5.0; Sodium 139    Wt Readings from Last 3 Encounters:  08/20/24 235 lb 6.4 oz (106.8 kg)  07/29/24 228 lb (103.4 kg)  07/07/24 233 lb (105.7 kg)    Assessment and Plan:   1. Aortic Valve Insufficiency: He has moderate to severe aortic valve insufficiency. Unfortunately, we are unable to offer him TAVR to treat his AV disease as this device is not approved for aortic insufficiency. He has limited leaflet and annular calcium  and only mild aortic stenosis. He also has moderate MR. Fortunately he has seen considerable improvement in his exercise tolerance and dyspnea following his coronary stent intervention.  At this time, I think we should continue to follow his valvular heart disease. We will review his echo images at our structural heart meeting next week. We may need to consider a TEE to better evaluate the severity of his aortic valve insufficiency. I think that he would be a candidate for surgical AVR despite his advanced age as he is very functional. A surgical procedure would need to be delayed by several months following recent coronary stent placement.   He has follow up with Dr. Lavona in 2 weeks. We will have a plan in place by that time and will communicate this plan with Dr. Lavona.     Labs/ tests ordered today include:  No orders of the defined types were placed in this encounter.  Disposition:   F/U as planned with Dr. Lavona  Signed, Lonni Cash, MD, [facility] 08/20/2024 2:41 PM    [facility] [address] [place], [place],  [place]  [address] Phone: [phone]; Fax: [phone]

## 2024-08-25 NOTE — Progress Notes (Signed)
  HEART AND VASCULAR CENTER   MULTIDISCIPLINARY HEART VALVE TEAM  Pt's cased reviewed at valve team meeting, and the team is not convinced that he has severe AI based on TTE images. cMRI for quantification felt to not be a good option due to concomitant left sided lesion with at least moderate MR. Per review of notes, pt is not having symptoms and improved after recent PCI. We recommend that the pt be followed with serial echos for signs of LV dysfunction or worsening AI. If the pt develops symptoms of heart failure, we recommend proceeding with TEE and referring to TCTS for consideration of SAVR. The patient has a pending appointment with Dr Lavona on 09/04/2024.

## 2024-08-28 ENCOUNTER — Other Ambulatory Visit: Payer: Self-pay | Admitting: Cardiology

## 2024-08-28 ENCOUNTER — Other Ambulatory Visit (HOSPITAL_BASED_OUTPATIENT_CLINIC_OR_DEPARTMENT_OTHER): Payer: Self-pay

## 2024-08-28 MED ORDER — PANTOPRAZOLE SODIUM 40 MG PO TBEC
40.0000 mg | DELAYED_RELEASE_TABLET | Freq: Every day | ORAL | 0 refills | Status: DC
  Filled 2024-09-25: qty 90, 90d supply, fill #0

## 2024-08-28 MED ORDER — FLUZONE HIGH-DOSE 0.5 ML IM SUSY
0.5000 mL | PREFILLED_SYRINGE | Freq: Once | INTRAMUSCULAR | 0 refills | Status: AC
  Filled 2024-08-28: qty 0.5, 1d supply, fill #0

## 2024-09-03 NOTE — Progress Notes (Unsigned)
 Cardiology Office Note:   Date:  09/04/2024  ID:  JVION TURGEON, DOB May 19, 1941, MRN [id number] PCP: Geofm Franky CROME., MD  [facility] Providers Cardiologist:  Lynwood Schilling, MD Sleep Medicine:  Wilbert Bihari, MD {  History of Present Illness:   Vincent Rivas is a 83 y.o. male who presents for follow up of atrial fib.  He has had atrial fib with RVR.  e was found to have multi-vessel CAD by Cardiac CT in August 2025. AV calcium  score of 533. Cardiac cath 07/29/24 with severe, calcific disease in the proximal and mid LAD treated with atherectomy and stenting. At the time of his cath, the treating team was not aware of his severe aortic valve insufficiency. Echo performed two weeks after his cardiac cath on 08/13/24 showed LVEF=55-60%. Normal RV function. Moderate mitral regurgitation. Severe aortic valve insufficiency.    He was seen by Dr. Verlin and his case was discussed by the Structural Heart team  The team was not convinced that he has severe AI based on TTE images. cMRI for quantification felt to not be a good option due to concomitant left sided lesion with at least moderate MR. The patient was not having symptoms and improved had after recent PCI. We recommend that the pt be followed with serial echos for signs of LV dysfunction or worsening AI. If the pt develops symptoms of heart failure, we recommend proceeding with TEE and referring to TCTS for consideration of SAVR   He returns for follow up.  He says that he is breathing OK.  He is able to walk up the stairs without the dyspnea that he was having.  He is not having any resting shortness of breath.  Denies any PND or orthopnea.  He does not really notice his palpitations.  He has no presyncope or syncope.  He has no weight gain or edema.   ROS: As stated in the HPI and negative for all other systems.  Studies Reviewed:    EKG:     NA    Risk Assessment/Calculations:    CHA2DS2-VASc Score = 4   This indicates a 4.8%  annual risk of stroke. The patient's score is based upon: CHF History: 0 HTN History: 1 Diabetes History: 0 Stroke History: 0 Vascular Disease History: 1 Age Score: 2 Gender Score: 0    Physical Exam:   VS:  BP (!) 110/40 (BP Location: Left Arm, Patient Position: Sitting, Cuff Size: Large)   Pulse 62   Wt 233 lb (105.7 kg)   SpO2 99%   BMI 32.50 kg/m    Wt Readings from Last 3 Encounters:  09/04/24 233 lb (105.7 kg)  08/20/24 235 lb 6.4 oz (106.8 kg)  07/29/24 228 lb (103.4 kg)     GEN: Well nourished, well developed in no acute distress NECK: No JVD; No carotid bruits CARDIAC: RRR, very soft apical systolic murmur nonradiating, no diastolic murmurs, rubs, gallops RESPIRATORY:  Clear to auscultation without rales, wheezing or rhonchi  ABDOMEN: Soft, non-tender, non-distended EXTREMITIES:  No edema; No deformity   ASSESSMENT AND PLAN:   Atrial fib: He has some occasional palpitations.  At this point he is going to continue his Eliquis .  He is not having any unsustained tachypalpitations but if his heart rate goes above 80 he takes a beta-blocker.  Otherwise no change in therapy.   HTN: His blood pressure was at target.  No change in therapy.   CAD:    The patient is now  status post atherectomy and stenting.  His dyspnea which was the initiating complaint has improved.  I will see him back in February and most likely discontinue the Plavix .   OSA:   He uses CPAP.  AI:  I will follow up with an echo in one year.   He will get an echo in September 2026.  I would likely can conservatively manage this given his advanced age and spinal stenosis and balance issues and other comorbidities.  MR: This will be followed as above.        Follow up with me in February.  Signed, Lynwood Schilling, MD

## 2024-09-04 ENCOUNTER — Ambulatory Visit: Attending: Cardiology | Admitting: Cardiology

## 2024-09-04 ENCOUNTER — Encounter: Payer: Self-pay | Admitting: Cardiology

## 2024-09-04 VITALS — BP 110/40 | HR 62 | Wt 233.0 lb

## 2024-09-04 DIAGNOSIS — I10 Essential (primary) hypertension: Secondary | ICD-10-CM | POA: Diagnosis not present

## 2024-09-04 DIAGNOSIS — G4733 Obstructive sleep apnea (adult) (pediatric): Secondary | ICD-10-CM

## 2024-09-04 DIAGNOSIS — I480 Paroxysmal atrial fibrillation: Secondary | ICD-10-CM | POA: Diagnosis not present

## 2024-09-04 NOTE — Patient Instructions (Signed)
 Medication Instructions:  Continue same medications *If you need a refill on your cardiac medications before your next appointment, please call your pharmacy*  Lab Work: None ordered  Testing/Procedures: None ordered  Follow-Up: At [facility], you and your health needs are our priority.  As part of our continuing mission to provide you with exceptional heart care, our providers are all part of one team.  This team includes your primary Cardiologist (physician) and Advanced Practice Providers or APPs (Physician Assistants and Nurse Practitioners) who all work together to provide you with the care you need, when you need it.  Your next appointment:  5 months   Call in Nov to schedule Feb appointment     Provider:  Dr.Hochrein   We recommend signing up for the patient portal called MyChart.  Sign up information is provided on this After Visit Summary.  MyChart is used to connect with patients for Virtual Visits (Telemedicine).  Patients are able to view lab/test results, encounter notes, upcoming appointments, etc.  Non-urgent messages can be sent to your provider as well.   To learn more about what you can do with MyChart, go to [email or web address].

## 2024-09-14 ENCOUNTER — Other Ambulatory Visit (HOSPITAL_BASED_OUTPATIENT_CLINIC_OR_DEPARTMENT_OTHER): Payer: Self-pay

## 2024-09-17 ENCOUNTER — Other Ambulatory Visit (HOSPITAL_BASED_OUTPATIENT_CLINIC_OR_DEPARTMENT_OTHER): Payer: Self-pay

## 2024-09-17 MED ORDER — HYDROCODONE BIT-HOMATROP MBR 5-1.5 MG/5ML PO SOLN
5.0000 mL | Freq: Four times a day (QID) | ORAL | 0 refills | Status: DC | PRN
  Filled 2024-09-17: qty 120, 6d supply, fill #0

## 2024-09-17 MED ORDER — CEFDINIR 300 MG PO CAPS
300.0000 mg | ORAL_CAPSULE | Freq: Two times a day (BID) | ORAL | 0 refills | Status: AC
  Filled 2024-09-17: qty 14, 7d supply, fill #0

## 2024-09-25 ENCOUNTER — Other Ambulatory Visit: Payer: Self-pay

## 2024-09-25 ENCOUNTER — Other Ambulatory Visit (HOSPITAL_BASED_OUTPATIENT_CLINIC_OR_DEPARTMENT_OTHER): Payer: Self-pay

## 2024-09-26 ENCOUNTER — Other Ambulatory Visit (HOSPITAL_BASED_OUTPATIENT_CLINIC_OR_DEPARTMENT_OTHER): Payer: Self-pay

## 2024-09-26 MED ORDER — HYDROCODONE BIT-HOMATROP MBR 5-1.5 MG/5ML PO SOLN
5.0000 mL | Freq: Four times a day (QID) | ORAL | 0 refills | Status: AC
  Filled 2024-09-26: qty 120, 6d supply, fill #0

## 2024-09-28 ENCOUNTER — Other Ambulatory Visit (HOSPITAL_BASED_OUTPATIENT_CLINIC_OR_DEPARTMENT_OTHER): Payer: Self-pay

## 2024-10-05 ENCOUNTER — Other Ambulatory Visit (HOSPITAL_BASED_OUTPATIENT_CLINIC_OR_DEPARTMENT_OTHER): Payer: Self-pay

## 2024-10-05 ENCOUNTER — Other Ambulatory Visit: Payer: Self-pay | Admitting: Cardiology

## 2024-10-05 MED ORDER — HYDROCHLOROTHIAZIDE 25 MG PO TABS
25.0000 mg | ORAL_TABLET | Freq: Every day | ORAL | 3 refills | Status: AC
  Filled 2024-10-05: qty 90, 90d supply, fill #0
  Filled 2024-12-28: qty 90, 90d supply, fill #1

## 2024-10-06 ENCOUNTER — Other Ambulatory Visit (HOSPITAL_BASED_OUTPATIENT_CLINIC_OR_DEPARTMENT_OTHER): Payer: Self-pay

## 2024-10-06 ENCOUNTER — Other Ambulatory Visit: Payer: Self-pay

## 2024-10-06 MED ORDER — METOPROLOL SUCCINATE ER 50 MG PO TB24
50.0000 mg | ORAL_TABLET | Freq: Every day | ORAL | 3 refills | Status: AC
  Filled 2024-10-06: qty 90, 90d supply, fill #0
  Filled 2024-12-29: qty 90, 90d supply, fill #1

## 2024-10-06 NOTE — Telephone Encounter (Signed)
 Patient was decrease to 50 mg Toprol  Xl  at last discharge hospitalization 07/2024

## 2024-10-14 ENCOUNTER — Other Ambulatory Visit: Payer: Self-pay

## 2024-10-14 ENCOUNTER — Other Ambulatory Visit (HOSPITAL_BASED_OUTPATIENT_CLINIC_OR_DEPARTMENT_OTHER): Payer: Self-pay

## 2024-10-14 MED ORDER — METOPROLOL TARTRATE 25 MG PO TABS
25.0000 mg | ORAL_TABLET | Freq: Two times a day (BID) | ORAL | 3 refills | Status: AC
  Filled 2024-10-14: qty 180, 90d supply, fill #0

## 2024-10-27 ENCOUNTER — Other Ambulatory Visit (HOSPITAL_BASED_OUTPATIENT_CLINIC_OR_DEPARTMENT_OTHER): Payer: Self-pay

## 2024-10-28 ENCOUNTER — Other Ambulatory Visit (HOSPITAL_BASED_OUTPATIENT_CLINIC_OR_DEPARTMENT_OTHER): Payer: Self-pay

## 2024-11-04 ENCOUNTER — Other Ambulatory Visit (HOSPITAL_BASED_OUTPATIENT_CLINIC_OR_DEPARTMENT_OTHER): Payer: Self-pay

## 2024-11-06 ENCOUNTER — Other Ambulatory Visit (HOSPITAL_BASED_OUTPATIENT_CLINIC_OR_DEPARTMENT_OTHER): Payer: Self-pay

## 2024-11-09 ENCOUNTER — Other Ambulatory Visit: Payer: Self-pay

## 2024-11-09 ENCOUNTER — Other Ambulatory Visit (HOSPITAL_BASED_OUTPATIENT_CLINIC_OR_DEPARTMENT_OTHER): Payer: Self-pay

## 2024-11-09 MED ORDER — LISINOPRIL 40 MG PO TABS
40.0000 mg | ORAL_TABLET | Freq: Every day | ORAL | 4 refills | Status: AC
  Filled 2024-11-09: qty 90, 90d supply, fill #0

## 2024-11-10 ENCOUNTER — Other Ambulatory Visit (HOSPITAL_BASED_OUTPATIENT_CLINIC_OR_DEPARTMENT_OTHER): Payer: Self-pay

## 2024-11-11 ENCOUNTER — Other Ambulatory Visit (HOSPITAL_COMMUNITY): Payer: Self-pay

## 2024-11-12 ENCOUNTER — Other Ambulatory Visit (HOSPITAL_BASED_OUTPATIENT_CLINIC_OR_DEPARTMENT_OTHER): Payer: Self-pay

## 2024-11-27 ENCOUNTER — Other Ambulatory Visit (HOSPITAL_BASED_OUTPATIENT_CLINIC_OR_DEPARTMENT_OTHER): Payer: Self-pay

## 2024-12-07 ENCOUNTER — Other Ambulatory Visit (HOSPITAL_BASED_OUTPATIENT_CLINIC_OR_DEPARTMENT_OTHER): Payer: Self-pay

## 2024-12-07 MED ORDER — ZOLPIDEM TARTRATE 5 MG PO TABS
5.0000 mg | ORAL_TABLET | Freq: Every evening | ORAL | 5 refills | Status: AC | PRN
  Filled 2024-12-07: qty 30, 30d supply, fill #0
  Filled 2025-01-06: qty 30, 30d supply, fill #1

## 2024-12-21 ENCOUNTER — Other Ambulatory Visit: Payer: Self-pay | Admitting: Cardiology

## 2024-12-21 ENCOUNTER — Other Ambulatory Visit (HOSPITAL_BASED_OUTPATIENT_CLINIC_OR_DEPARTMENT_OTHER): Payer: Self-pay

## 2024-12-21 MED ORDER — PANTOPRAZOLE SODIUM 40 MG PO TBEC
40.0000 mg | DELAYED_RELEASE_TABLET | Freq: Every day | ORAL | 1 refills | Status: AC
  Filled 2024-12-21: qty 90, 90d supply, fill #0

## 2024-12-28 ENCOUNTER — Other Ambulatory Visit (HOSPITAL_BASED_OUTPATIENT_CLINIC_OR_DEPARTMENT_OTHER): Payer: Self-pay

## 2024-12-30 ENCOUNTER — Other Ambulatory Visit (HOSPITAL_BASED_OUTPATIENT_CLINIC_OR_DEPARTMENT_OTHER): Payer: Self-pay

## 2025-01-06 ENCOUNTER — Other Ambulatory Visit (HOSPITAL_BASED_OUTPATIENT_CLINIC_OR_DEPARTMENT_OTHER): Payer: Self-pay

## 2025-01-07 ENCOUNTER — Other Ambulatory Visit (HOSPITAL_BASED_OUTPATIENT_CLINIC_OR_DEPARTMENT_OTHER): Payer: Self-pay
